# Patient Record
Sex: Female | Born: 1955 | Race: White | Hispanic: No | Marital: Married | Smoking: Never smoker
Health system: Southern US, Community
[De-identification: ages and names within clinical notes are randomized; demographics above are authoritative.]

## PROBLEM LIST (undated history)

## (undated) DIAGNOSIS — K219 Gastro-esophageal reflux disease without esophagitis: Secondary | ICD-10-CM

## (undated) DIAGNOSIS — G629 Polyneuropathy, unspecified: Secondary | ICD-10-CM

## (undated) DIAGNOSIS — R112 Nausea with vomiting, unspecified: Secondary | ICD-10-CM

## (undated) DIAGNOSIS — R234 Changes in skin texture: Secondary | ICD-10-CM

## (undated) DIAGNOSIS — I739 Peripheral vascular disease, unspecified: Secondary | ICD-10-CM

## (undated) DIAGNOSIS — Z9889 Other specified postprocedural states: Secondary | ICD-10-CM

## (undated) DIAGNOSIS — K146 Glossodynia: Secondary | ICD-10-CM

## (undated) DIAGNOSIS — M797 Fibromyalgia: Secondary | ICD-10-CM

## (undated) DIAGNOSIS — I872 Venous insufficiency (chronic) (peripheral): Secondary | ICD-10-CM

## (undated) DIAGNOSIS — M1990 Unspecified osteoarthritis, unspecified site: Secondary | ICD-10-CM

## (undated) DIAGNOSIS — F419 Anxiety disorder, unspecified: Secondary | ICD-10-CM

## (undated) DIAGNOSIS — D649 Anemia, unspecified: Secondary | ICD-10-CM

## (undated) DIAGNOSIS — J189 Pneumonia, unspecified organism: Secondary | ICD-10-CM

## (undated) HISTORY — PX: HAMMER TOE SURGERY: SHX385

## (undated) HISTORY — PX: NASAL SEPTUM SURGERY: SHX37

## (undated) HISTORY — PX: TONSILLECTOMY: SUR1361

## (undated) HISTORY — PX: VAGINAL HYSTERECTOMY: SUR661

## (undated) HISTORY — PX: CARPAL TUNNEL RELEASE: SHX101

## (undated) HISTORY — PX: COLONOSCOPY: SHX174

## (undated) HISTORY — PX: TUBAL LIGATION: SHX77

---

## 2011-01-10 HISTORY — PX: BACK SURGERY: SHX140

## 2014-02-16 ENCOUNTER — Other Ambulatory Visit: Payer: Self-pay | Admitting: Neurosurgery

## 2014-03-02 ENCOUNTER — Inpatient Hospital Stay (HOSPITAL_COMMUNITY)
Admission: RE | Admit: 2014-03-02 | Discharge: 2014-03-02 | Disposition: A | Discharge status: Home / Self Care | Payer: Self-pay | Source: Ambulatory Visit

## 2014-03-02 ENCOUNTER — Other Ambulatory Visit (HOSPITAL_COMMUNITY): Payer: Self-pay | Admitting: *Deleted

## 2014-03-02 NOTE — Pre-Procedure Instructions (Signed)
Robyn BryantLinda T. Robyn Smith  03/02/2014   Your procedure is scheduled on:  Tuesday, March 10, 2014 at 7:30 AM.   [facility] Entr[facility] "A" Admitting Office at 5:30 AM.   Call this number if you have problems the morning of surgery: [phone]               Any questions prior to day of surgery, please call [phone] between 8 & 4 PM.   Remember:   Do not eat food or drink liquids after midnight Monday, 03/09/14.   Take these medicines the morning of surgery with A SIP OF WATER: Alprazolam (Xanax) - if needed, Oxcycodone-Acetaminophen (Percocet) - if needed.  Stop Diclofenac (Voltaren) as of tomorrow (03/03/14).   Do not wear jewelry, make-up or nail polish.  Do not wear lotions, powders, or perfumes. You may wear deodorant.  Do not shave 48 hours prior to surgery.   Do not bring valuables to the hospital.  [facility] not responsible                  for any belongings or valuables.               Contacts, dentures or bridgework may not be worn into surgery.  Leave suitcase in the car. After surgery it may be brought to your room.  For patients admitted to the hospital, discharge time is determined by your                treatment team.             Special Instructions: [facility] - Preparing for Surgery  Before surgery, you can play an important role.  Because skin is not sterile, your skin needs to be as free of germs as possible.  You can reduce the number of germs on you skin by washing with CHG (chlorahexidine gluconate) soap before surgery.  CHG is an antiseptic cleaner which kills germs and bonds with the skin to continue killing germs even after washing.  Please DO NOT use if you have an allergy to CHG or antibacterial soaps.  If your skin becomes reddened/irritated stop using the CHG and inform your nurse when you arrive at Short Stay.  Do not shave (including legs and underarms) for at least 48 hours prior to the first CHG shower.  You may shave your  face.  Please follow these instructions carefully:   1.  Shower with CHG Soap the night before surgery and the                                morning of Surgery.  2.  If you choose to wash your hair, wash your hair first as usual with your       normal shampoo.  3.  After you shampoo, rinse your hair and body thoroughly to remove the                      Shampoo.  4.  Use CHG as you would any other liquid soap.  You can apply chg directly       to the skin and wash gently with scrungie or a clean washcloth.  5.  Apply the CHG Soap to your body ONLY FROM THE NECK DOWN.        Do not use on open wounds or open sores.  Avoid contact with your eyes,  ears, mouth and genitals (private parts).  Wash genitals (private parts) with your normal soap.  6.  Wash thoroughly, paying special attention to the area where your surgery        will be performed.  7.  Thoroughly rinse your body with warm water from the neck down.  8.  DO NOT shower/wash with your normal soap after using and rinsing off       the CHG Soap.  9.  Pat yourself dry with a clean towel.            10.  Wear clean pajamas.            11.  Place clean sheets on your bed the night of your first shower and do not        sleep with pets.  Day of Surgery  Do not apply any lotions the morning of surgery.  Please wear clean clothes to the hospital.     Please read over the following fact sheets that you were given: Pain Booklet, Coughing and Deep Breathing, Blood Transfusion Information, MRSA Information and Surgical Site Infection Prevention

## 2014-03-04 ENCOUNTER — Encounter (HOSPITAL_COMMUNITY)
Admission: RE | Admit: 2014-03-04 | Discharge: 2014-03-04 | Disposition: A | Discharge status: Home / Self Care | Payer: BLUE CROSS/BLUE SHIELD | Source: Ambulatory Visit | Attending: Neurosurgery | Admitting: Neurosurgery

## 2014-03-04 ENCOUNTER — Encounter (HOSPITAL_COMMUNITY): Payer: Self-pay

## 2014-03-04 DIAGNOSIS — Z01812 Encounter for preprocedural laboratory examination: Secondary | ICD-10-CM | POA: Insufficient documentation

## 2014-03-04 DIAGNOSIS — M4806 Spinal stenosis, lumbar region: Secondary | ICD-10-CM | POA: Insufficient documentation

## 2014-03-04 DIAGNOSIS — Z0183 Encounter for blood typing: Secondary | ICD-10-CM | POA: Insufficient documentation

## 2014-03-04 DIAGNOSIS — M4316 Spondylolisthesis, lumbar region: Secondary | ICD-10-CM | POA: Diagnosis not present

## 2014-03-04 DIAGNOSIS — Z01818 Encounter for other preprocedural examination: Secondary | ICD-10-CM | POA: Diagnosis present

## 2014-03-04 DIAGNOSIS — M4120 Other idiopathic scoliosis, site unspecified: Secondary | ICD-10-CM | POA: Insufficient documentation

## 2014-03-04 DIAGNOSIS — Z880 Allergy status to penicillin: Secondary | ICD-10-CM | POA: Insufficient documentation

## 2014-03-04 HISTORY — DX: Fibromyalgia: M79.7

## 2014-03-04 HISTORY — DX: Anxiety disorder, unspecified: F41.9

## 2014-03-04 HISTORY — DX: Gastro-esophageal reflux disease without esophagitis: K21.9

## 2014-03-04 HISTORY — DX: Peripheral vascular disease, unspecified: I73.9

## 2014-03-04 HISTORY — DX: Pneumonia, unspecified organism: J18.9

## 2014-03-04 HISTORY — DX: Other specified postprocedural states: Z98.890

## 2014-03-04 HISTORY — DX: Anemia, unspecified: D64.9

## 2014-03-04 HISTORY — DX: Unspecified osteoarthritis, unspecified site: M19.90

## 2014-03-04 HISTORY — DX: Nausea with vomiting, unspecified: R11.2

## 2014-03-04 LAB — CBC
HCT: 38.7 % (ref 36.0–46.0)
Hemoglobin: 12.9 g/dL (ref 12.0–15.0)
MCH: 31 pg (ref 26.0–34.0)
MCHC: 33.3 g/dL (ref 30.0–36.0)
MCV: 93 fL (ref 78.0–100.0)
PLATELETS: 294 10*3/uL (ref 150–400)
RBC: 4.16 MIL/uL (ref 3.87–5.11)
RDW: 12.8 % (ref 11.5–15.5)
WBC: 6.7 10*3/uL (ref 4.0–10.5)

## 2014-03-04 LAB — BASIC METABOLIC PANEL
ANION GAP: 11 (ref 5–15)
BUN: 14 mg/dL (ref 6–23)
CALCIUM: 9.2 mg/dL (ref 8.4–10.5)
CO2: 25 mmol/L (ref 19–32)
CREATININE: 0.74 mg/dL (ref 0.50–1.10)
Chloride: 104 mmol/L (ref 96–112)
GFR calc non Af Amer: >90 mL/min (ref >90)
Glucose, Bld: 97 mg/dL (ref 70–99)
Potassium: 4.1 mmol/L (ref 3.5–5.1)
Sodium: 140 mmol/L (ref 135–145)

## 2014-03-04 LAB — TYPE AND SCREEN
ABO/RH(D): O POS
Antibody Screen: NEGATIVE

## 2014-03-04 LAB — ABO/RH: ABO/RH(D): O POS

## 2014-03-04 LAB — SURGICAL PCR SCREEN
MRSA, PCR: NEGATIVE
Staphylococcus aureus: POSITIVE — AB

## 2014-03-04 NOTE — Progress Notes (Signed)
Pt states she had chest pain a "couple of years ago" and was told it was GERD. States she just continues to see the cardiologist once a year. States she's had an Echo and Stress test within the last 2 years and possibly an EKG this past year. Requested Echo, Stress and EKG from Dr. Oneta RackKotlaba's [place] in [place], [place].

## 2014-03-07 NOTE — H&P (Signed)
Patient ID:   [id number] Patient: Robyn Smith  Date of Birth: 1955/06/19 Visit Type: Patient Communication   Date: 03/02/2014 11:19 AM Provider: Danae Orleans. Venetia Maxon MD   This 59 year old female presents for Spinal stenosis.  History of Present Illness: 1.  Spinal stenosis  My recommendation for surgery to this patient is based on severe foraminal and extraforaminal nerve root compression due to spinal stenosis which is secondary to lumbar scoliosis.  I do not believe there is a nonsurgical option for correcting this problem and the patient is already undergone extensive pain management and injection therapy as well as physical therapy.  The insurance reviewer's interpretation that she does not have instability is not accurate based on my review of her radiographs.  This is outlined below.        MEDICATIONS(added, continued or stopped this visit): Started Medication Directions Instruction Stopped   arnica flower (bulk) tincture     02/11/2014 diclofenac sodium 75 mg tablet,delayed release take 1 tablet by oral route 2 times every day    02/17/2014 Percocet 10 mg-325 mg tablet take 1 tablet by oral route  every 6-8 hours as needed, 30 day supply, DNF 02/17/14    01/18/2014 Percocet 10 mg-325 mg tablet take 1 tablet by oral route  every 6-8 hours as needed, 30 day supply, DNF 01/18/14       ALLERGIES: Ingredient Reaction Medication Name Comment  PENICILLINS          DIAGNOSTIC RESULTS Insurance reviewer has apparently denied Mrs. Santo's request for surgery.  They state the grounds that there is no evidence of instability of her lumbar spine.  This is absolutely not the case.  The patient has coronal spondylolisthesis and malalignment at L3 L4 of 12 mm appreciated on AP film and this is contributing to significant foraminal and extraforaminal L3 nerve root compression on the left.  Additionally there is grade 1 spondylolisthesis of L4 and L5 of 6.5 mm on flexion which reduces to 0  mm on extension and the Cobb angle of her scoliosis is 20.    IMPRESSION The patient demonstrates spinal instability and the reason for proceeding with scoliosis correction is because of nerve compression rather than prophylactically for back pain.  I have requested an expedited.  Review of her situation as I think that these facts reflected in my review of her radiographs support my recommendation for surgery.             Provider:  Danae Orleans. Venetia Maxon MD  03/04/2014 05:12 PM Dictation edited by: Danae Orleans. Venetia Maxon    CC Providers: Carlota Raspberry [address] [place],  [place]  [address]-   Carlota Raspberry [address] [place], [place] [address]-              Electronically signed by Danae Orleans. Venetia Maxon MD on 03/04/2014 05:12 PM  Patient ID:   [id number] Patient: Robyn Smith  Date of Birth: 1955-02-24 Visit Type: Office Visit   Date: 02/11/2014 02:15 PM Provider: Danae Orleans. Venetia Maxon MD   This 59 year old female presents for back pain.  History of Present Illness: 1.  back pain  Last seen by Dr. Venetia Maxon with ACDF C5-C6 12/29/10, Robyn Smith returns reporting lumbar pain that has been managed by Dr. Ollen Bowl and Celedonio Miyamoto PA-C since 2013.  Her primary care physician in [place], Dr. Harland Dingwall, ordered an MRI in December and encouraged her to come out of work.   She reports no problems with her neck or  upper extremities since her surgery in 2012.  Lumbar MRI on [place]  Patient has bilateral sciatic notch discomfort and bilateral lower extremity pain, right greater than left with pain radiating into both of her ankles.  Lumbar radiographs demonstrate dextral convex scoliosis and her MRI demonstrates significant stenosis with severe foraminal stenosis throughout her lumbar spine.  Patient has severe pain and is limited in her ability to function and wishes to proceed with surgery.  She says that injections are no longer giving her relief.  She is concerned about her ability to return  to work as she says she has to lift greater than 80 pounds at [employer].      Medical/Surgical/Interim History Reviewed, no change.  Last detailed document date:08/19/2013.   PAST MEDICAL HISTORY, SURGICAL HISTORY, FAMILY HISTORY, SOCIAL HISTORY AND REVIEW OF SYSTEMS I have reviewed the patient's past medical, surgical, family and social history as well as the comprehensive review of systems as included on the [facility] history form dated 08/19/2013, which I have signed.  Family History: Reviewed, no changes.    Social History: Tobacco use reviewed. Reviewed, no changes.     MEDICATIONS(added, continued or stopped this visit): Started Medication Directions Instruction Stopped   arnica flower (bulk) tincture     02/11/2014 diclofenac sodium 75 mg tablet,delayed release take 1 tablet by oral route 2 times every day     meloxicam 7.5 mg tablet take 1 tablet by oral route 2 times every day  02/11/2014  11/20/2013 nortriptyline 75 mg capsule take 1 capsule by oral route  every 2 days  02/11/2014  11/20/2013 Percocet 10 mg-325 mg tablet take 1 tablet by oral route  every 6-8 hours as needed, 30 day supply  02/11/2014  12/19/2013 Percocet 10 mg-325 mg tablet take 1 tablet by oral route  every 6-8 hours as needed, 30 day supply, DNF 12/19/13  02/11/2014  01/18/2014 Percocet 10 mg-325 mg tablet take 1 tablet by oral route  every 6-8 hours as needed, 30 day supply, DNF 01/18/14       ALLERGIES: Ingredient Reaction Medication Name Comment  PENICILLINS      Reviewed, no changes.    Vitals Date Temp F BP Pulse Ht In Wt Lb BMI BSA Pain Score  02/11/2014  115/73 82 69 192 28.35  7/10      IMPRESSION Severe scoliosis, spinal stenosis, spondylolisthesis, radiculopathy L1-2, L2-3, L3 L4, L4-L5 levels  Completed Orders (this encounter) Order Details Reason Side Interpretation Result Initial Treatment Date Region  Lifestyle education regarding diet  Encouraged to eat a well balanced diet and follow up with primary care physician.        Lumbar Spine- AP/Lat/Flex/Ex 1 of 3     02/11/2014 All Levels to All Levels  Hip - Right Lateral W/AP Pelvis 2 of 3     02/11/2014 All Levels to All Levels  Scoliosis- AP/Lat 3 of 3 and RT HIP SERIES and XR 4V L/S     02/11/2014 All Levels to All Levels   Assessment/Plan # Detail Type Description   1. Assessment Spinal stenosis of lumbar region (M48.06).       2. Assessment Spondylolisthesis, lumbar region (M43.16).       3. Assessment Body mass index (BMI) 28.0-28.9, adult (Z61.09).   Plan Orders Today's instructions / counseling include(s) Lifestyle education regarding diet.       4. Assessment Trochanteric bursitis of right hip (M70.61).       5. Assessment Radiculopathy, lumbar  region (M54.16).       6. Assessment Scoliosis (and kyphoscoliosis), idiopathic (M41.20).         Pain Assessment/Treatment Pain Scale: 7/10. Method: Numeric Pain Intensity Scale. Location: back. Onset: 08/19/2013. Duration: varies. Quality: discomforting. Pain Assessment/Treatment follow-up plan of care: Patient is taking medications as prescribed..  Anterolateral decompression and fusion with left-sided approach L1-2, L2-3, L3 L4, L4 L5 levels with percutaneous pedicle screw fixation.  LSO brace is fitted today.  Risks and benefits were discussed in detail with regard to surgery as well as alternatives to surgery.the patient wishes to proceed with surgery.  Detailed models and explanations were provided as well as patient teaching.  Orders: Diagnostic Procedures: Assessment Procedure  M54.16 Lumbar Spine- AP/Lat/Flex/Ex  M54.16 Scoliosis- AP/Lat  M54.16 XLIF (left)  - L1-L2 - L2-L3 - L3-L4 - L4-L5 ; posterior percutaneous pedicle screws and rods  M70.61 Hip - Right Lateral W/AP Pelvis  Instruction(s)/Education: Assessment Instruction  Z68.28 Lifestyle education regarding diet             Provider:   Danae OrleansJoseph D. Venetia MaxonStern MD  02/22/2014 03:08 PM Dictation edited by: Danae OrleansJoseph D. Venetia MaxonStern    CC Providers: Carlota RaspberryEric  Davidson [address] [place],  [place]  [phone]-   Carlota RaspberryEric Davidson [address] [place], [place] [phone]-              Electronically signed by Danae OrleansJoseph D. Venetia MaxonStern MD on 02/22/2014 03:08 PM

## 2014-03-09 MED ORDER — VANCOMYCIN HCL IN DEXTROSE 1-5 GM/200ML-% IV SOLN
1000.0000 mg | INTRAVENOUS | Status: DC
  Filled 2014-03-09: qty 200

## 2014-03-10 ENCOUNTER — Encounter (HOSPITAL_COMMUNITY): Payer: Self-pay | Admitting: *Deleted

## 2014-03-10 ENCOUNTER — Inpatient Hospital Stay (HOSPITAL_COMMUNITY)
Admission: RE | Admit: 2014-03-10 | Discharge: 2014-03-13 | DRG: 458 | Disposition: A | Discharge status: Home Health | Payer: BLUE CROSS/BLUE SHIELD | Source: Ambulatory Visit | Attending: Neurosurgery | Admitting: Neurosurgery

## 2014-03-10 ENCOUNTER — Inpatient Hospital Stay (HOSPITAL_COMMUNITY): Payer: BLUE CROSS/BLUE SHIELD

## 2014-03-10 ENCOUNTER — Inpatient Hospital Stay (HOSPITAL_COMMUNITY): Payer: BLUE CROSS/BLUE SHIELD | Admitting: Certified Registered Nurse Anesthetist

## 2014-03-10 ENCOUNTER — Encounter (HOSPITAL_COMMUNITY)
Admission: RE | Disposition: A | Discharge status: Home Health | Payer: BLUE CROSS/BLUE SHIELD | Source: Ambulatory Visit | Attending: Neurosurgery

## 2014-03-10 DIAGNOSIS — M5416 Radiculopathy, lumbar region: Secondary | ICD-10-CM | POA: Diagnosis present

## 2014-03-10 DIAGNOSIS — M4310 Spondylolisthesis, site unspecified: Secondary | ICD-10-CM | POA: Diagnosis present

## 2014-03-10 DIAGNOSIS — M419 Scoliosis, unspecified: Secondary | ICD-10-CM | POA: Diagnosis present

## 2014-03-10 DIAGNOSIS — M4806 Spinal stenosis, lumbar region: Secondary | ICD-10-CM | POA: Diagnosis present

## 2014-03-10 DIAGNOSIS — Z419 Encounter for procedure for purposes other than remedying health state, unspecified: Secondary | ICD-10-CM

## 2014-03-10 DIAGNOSIS — M4126 Other idiopathic scoliosis, lumbar region: Secondary | ICD-10-CM | POA: Diagnosis present

## 2014-03-10 HISTORY — PX: ANTERIOR LATERAL LUMBAR FUSION 4 LEVELS: SHX5552

## 2014-03-10 HISTORY — PX: LUMBAR PERCUTANEOUS PEDICLE SCREW 4 LEVEL: SHX6318

## 2014-03-10 SURGERY — ANTERIOR LATERAL LUMBAR FUSION 4 LEVELS
Anesthesia: General | Site: Spine Lumbar | Laterality: Left

## 2014-03-10 MED ORDER — ONDANSETRON HCL 4 MG/2ML IJ SOLN
INTRAMUSCULAR | Status: DC | PRN
  Administered 2014-03-10: 4 mg via INTRAVENOUS

## 2014-03-10 MED ORDER — 0.9 % SODIUM CHLORIDE (POUR BTL) OPTIME
TOPICAL | Status: DC | PRN
  Administered 2014-03-10: 1000 mL

## 2014-03-10 MED ORDER — FENTANYL CITRATE 0.05 MG/ML IJ SOLN
INTRAMUSCULAR | Status: AC
  Filled 2014-03-10: qty 5

## 2014-03-10 MED ORDER — HYDROMORPHONE HCL 1 MG/ML IJ SOLN
0.5000 mg | INTRAMUSCULAR | Status: DC | PRN
  Administered 2014-03-10 – 2014-03-12 (×12): 1 mg via INTRAVENOUS
  Filled 2014-03-10 (×13): qty 1

## 2014-03-10 MED ORDER — ALPRAZOLAM 0.25 MG PO TABS
0.2500 mg | ORAL_TABLET | Freq: Two times a day (BID) | ORAL | Status: DC | PRN
  Administered 2014-03-12 (×3): 0.25 mg via ORAL
  Filled 2014-03-10 (×3): qty 1

## 2014-03-10 MED ORDER — METHOCARBAMOL 1000 MG/10ML IJ SOLN
500.0000 mg | Freq: Four times a day (QID) | INTRAVENOUS | Status: DC | PRN
  Filled 2014-03-10: qty 5

## 2014-03-10 MED ORDER — PROMETHAZINE HCL 25 MG/ML IJ SOLN: 6.2500 mg | INTRAMUSCULAR | Status: DC | PRN

## 2014-03-10 MED ORDER — PANTOPRAZOLE SODIUM 40 MG IV SOLR
40.0000 mg | Freq: Every day | INTRAVENOUS | Status: DC
  Administered 2014-03-10 – 2014-03-11 (×2): 40 mg via INTRAVENOUS
  Filled 2014-03-10 (×2): qty 40

## 2014-03-10 MED ORDER — KCL IN DEXTROSE-NACL 20-5-0.45 MEQ/L-%-% IV SOLN
INTRAVENOUS | Status: AC
  Filled 2014-03-10: qty 1000

## 2014-03-10 MED ORDER — DOCUSATE SODIUM 100 MG PO CAPS
100.0000 mg | ORAL_CAPSULE | Freq: Two times a day (BID) | ORAL | Status: DC
  Administered 2014-03-10 – 2014-03-13 (×6): 100 mg via ORAL
  Filled 2014-03-10 (×6): qty 1

## 2014-03-10 MED ORDER — BUPIVACAINE HCL (PF) 0.5 % IJ SOLN
INTRAMUSCULAR | Status: DC | PRN
  Administered 2014-03-10: 17.5 mL
  Administered 2014-03-10: 7.5 mL

## 2014-03-10 MED ORDER — ONDANSETRON HCL 4 MG/2ML IJ SOLN
4.0000 mg | INTRAMUSCULAR | Status: DC | PRN
  Administered 2014-03-10 – 2014-03-12 (×3): 4 mg via INTRAVENOUS
  Filled 2014-03-10 (×3): qty 2

## 2014-03-10 MED ORDER — ARTIFICIAL TEARS OP OINT
TOPICAL_OINTMENT | OPHTHALMIC | Status: DC | PRN
  Administered 2014-03-10: 1 via OPHTHALMIC

## 2014-03-10 MED ORDER — KCL IN DEXTROSE-NACL 20-5-0.45 MEQ/L-%-% IV SOLN
INTRAVENOUS | Status: DC
  Administered 2014-03-10: 15:00:00 via INTRAVENOUS

## 2014-03-10 MED ORDER — SODIUM CHLORIDE 0.9 % IJ SOLN
3.0000 mL | Freq: Two times a day (BID) | INTRAMUSCULAR | Status: DC
  Administered 2014-03-11 – 2014-03-12 (×3): 3 mL via INTRAVENOUS

## 2014-03-10 MED ORDER — ALUM & MAG HYDROXIDE-SIMETH 200-200-20 MG/5ML PO SUSP: 30.0000 mL | Freq: Four times a day (QID) | ORAL | Status: DC | PRN

## 2014-03-10 MED ORDER — MENTHOL 3 MG MT LOZG: 1.0000 | LOZENGE | OROMUCOSAL | Status: DC | PRN

## 2014-03-10 MED ORDER — HYDROMORPHONE HCL 1 MG/ML IJ SOLN
0.2500 mg | INTRAMUSCULAR | Status: DC | PRN
  Administered 2014-03-10 (×3): 0.5 mg via INTRAVENOUS

## 2014-03-10 MED ORDER — EPHEDRINE SULFATE 50 MG/ML IJ SOLN
INTRAMUSCULAR | Status: DC | PRN
  Administered 2014-03-10 (×3): 10 mg via INTRAVENOUS

## 2014-03-10 MED ORDER — SODIUM CHLORIDE 0.9 % IJ SOLN: 3.0000 mL | INTRAMUSCULAR | Status: DC | PRN

## 2014-03-10 MED ORDER — EPHEDRINE SULFATE 50 MG/ML IJ SOLN
INTRAMUSCULAR | Status: AC
  Filled 2014-03-10: qty 1

## 2014-03-10 MED ORDER — PROPOFOL 10 MG/ML IV EMUL
INTRAVENOUS | Status: AC
  Administered 2014-03-10 (×2): 50 ug/kg/min via INTRAVENOUS
  Filled 2014-03-10: qty 200

## 2014-03-10 MED ORDER — SUCCINYLCHOLINE CHLORIDE 20 MG/ML IJ SOLN
INTRAMUSCULAR | Status: AC
  Filled 2014-03-10: qty 2

## 2014-03-10 MED ORDER — LIDOCAINE HCL (CARDIAC) 20 MG/ML IV SOLN
INTRAVENOUS | Status: AC
  Filled 2014-03-10: qty 5

## 2014-03-10 MED ORDER — MIDAZOLAM HCL 2 MG/2ML IJ SOLN
INTRAMUSCULAR | Status: AC
  Filled 2014-03-10: qty 2

## 2014-03-10 MED ORDER — DEXAMETHASONE SODIUM PHOSPHATE 4 MG/ML IJ SOLN
INTRAMUSCULAR | Status: DC | PRN
  Administered 2014-03-10: 8 mg via INTRAVENOUS

## 2014-03-10 MED ORDER — ACETAMINOPHEN 325 MG PO TABS: 650.0000 mg | ORAL_TABLET | ORAL | Status: DC | PRN

## 2014-03-10 MED ORDER — HYDROCODONE-ACETAMINOPHEN 5-325 MG PO TABS: 1.0000 | ORAL_TABLET | ORAL | Status: DC | PRN

## 2014-03-10 MED ORDER — LIDOCAINE-EPINEPHRINE 1 %-1:100000 IJ SOLN
INTRAMUSCULAR | Status: DC | PRN
  Administered 2014-03-10: 7.5 mL
  Administered 2014-03-10: 17.5 mL

## 2014-03-10 MED ORDER — STERILE WATER FOR INJECTION IJ SOLN
INTRAMUSCULAR | Status: AC
  Filled 2014-03-10: qty 10

## 2014-03-10 MED ORDER — SENNA 8.6 MG PO TABS
1.0000 | ORAL_TABLET | Freq: Two times a day (BID) | ORAL | Status: DC
  Administered 2014-03-10 – 2014-03-13 (×6): 8.6 mg via ORAL
  Filled 2014-03-10 (×6): qty 1

## 2014-03-10 MED ORDER — VANCOMYCIN HCL IN DEXTROSE 1-5 GM/200ML-% IV SOLN
INTRAVENOUS | Status: AC
  Administered 2014-03-10: 1000 mg via INTRAVENOUS
  Filled 2014-03-10: qty 200

## 2014-03-10 MED ORDER — SODIUM CHLORIDE 0.9 % IV SOLN: 250.0000 mL | INTRAVENOUS | Status: DC

## 2014-03-10 MED ORDER — PROMETHAZINE HCL 25 MG/ML IJ SOLN
12.5000 mg | Freq: Four times a day (QID) | INTRAMUSCULAR | Status: DC | PRN
  Administered 2014-03-10 – 2014-03-11 (×3): 12.5 mg via INTRAVENOUS
  Filled 2014-03-10 (×3): qty 1

## 2014-03-10 MED ORDER — FENTANYL CITRATE 0.05 MG/ML IJ SOLN
INTRAMUSCULAR | Status: DC | PRN
  Administered 2014-03-10: 100 ug via INTRAVENOUS
  Administered 2014-03-10 (×2): 50 ug via INTRAVENOUS
  Administered 2014-03-10: 100 ug via INTRAVENOUS
  Administered 2014-03-10 (×3): 50 ug via INTRAVENOUS
  Administered 2014-03-10: 100 ug via INTRAVENOUS
  Administered 2014-03-10: 50 ug via INTRAVENOUS

## 2014-03-10 MED ORDER — MIDAZOLAM HCL 5 MG/5ML IJ SOLN
INTRAMUSCULAR | Status: DC | PRN
  Administered 2014-03-10: 2 mg via INTRAVENOUS

## 2014-03-10 MED ORDER — METHOCARBAMOL 500 MG PO TABS
500.0000 mg | ORAL_TABLET | Freq: Four times a day (QID) | ORAL | Status: DC | PRN
  Administered 2014-03-10 – 2014-03-13 (×10): 500 mg via ORAL
  Filled 2014-03-10 (×11): qty 1

## 2014-03-10 MED ORDER — SUCCINYLCHOLINE CHLORIDE 20 MG/ML IJ SOLN
INTRAMUSCULAR | Status: DC | PRN
  Administered 2014-03-10: 120 mg via INTRAVENOUS

## 2014-03-10 MED ORDER — PHENYLEPHRINE HCL 10 MG/ML IJ SOLN
INTRAMUSCULAR | Status: DC | PRN
  Administered 2014-03-10 (×5): 80 ug via INTRAVENOUS

## 2014-03-10 MED ORDER — OXYCODONE-ACETAMINOPHEN 10-325 MG PO TABS: 1.0000 | ORAL_TABLET | ORAL | Status: DC | PRN

## 2014-03-10 MED ORDER — LIDOCAINE HCL (CARDIAC) 20 MG/ML IV SOLN
INTRAVENOUS | Status: DC | PRN
  Administered 2014-03-10: 80 mg via INTRAVENOUS

## 2014-03-10 MED ORDER — PROPOFOL 10 MG/ML IV BOLUS
INTRAVENOUS | Status: AC
  Filled 2014-03-10: qty 20

## 2014-03-10 MED ORDER — HEMOSTATIC AGENTS (NO CHARGE) OPTIME
TOPICAL | Status: DC | PRN
  Administered 2014-03-10: 1 via TOPICAL

## 2014-03-10 MED ORDER — SODIUM CHLORIDE 0.9 % IV SOLN
1250.0000 mg | Freq: Once | INTRAVENOUS | Status: AC
  Administered 2014-03-10: 1250 mg via INTRAVENOUS
  Filled 2014-03-10: qty 1250

## 2014-03-10 MED ORDER — PROPOFOL 10 MG/ML IV BOLUS
INTRAVENOUS | Status: DC | PRN
  Administered 2014-03-10: 180 mg via INTRAVENOUS

## 2014-03-10 MED ORDER — HYDROMORPHONE HCL 1 MG/ML IJ SOLN
INTRAMUSCULAR | Status: AC
  Administered 2014-03-10: 17:00:00
  Filled 2014-03-10: qty 1

## 2014-03-10 MED ORDER — OXYCODONE-ACETAMINOPHEN 5-325 MG PO TABS
1.0000 | ORAL_TABLET | ORAL | Status: DC | PRN
  Administered 2014-03-10 – 2014-03-13 (×11): 2 via ORAL
  Filled 2014-03-10 (×11): qty 2

## 2014-03-10 MED ORDER — PHENOL 1.4 % MT LIQD: 1.0000 | OROMUCOSAL | Status: DC | PRN

## 2014-03-10 MED ORDER — THROMBIN 5000 UNITS EX SOLR
CUTANEOUS | Status: DC | PRN
  Administered 2014-03-10 (×2): 5000 [IU] via TOPICAL

## 2014-03-10 MED ORDER — LACTATED RINGERS IV SOLN
INTRAVENOUS | Status: DC | PRN
  Administered 2014-03-10 (×3): via INTRAVENOUS

## 2014-03-10 MED ORDER — ACETAMINOPHEN 650 MG RE SUPP: 650.0000 mg | RECTAL | Status: DC | PRN

## 2014-03-10 MED ORDER — ONDANSETRON HCL 4 MG/2ML IJ SOLN
INTRAMUSCULAR | Status: AC
  Filled 2014-03-10: qty 2

## 2014-03-10 MED ORDER — HYDROMORPHONE HCL 1 MG/ML IJ SOLN
INTRAMUSCULAR | Status: AC
  Administered 2014-03-10: 0.5 mg
  Filled 2014-03-10: qty 1

## 2014-03-10 MED ORDER — ROCURONIUM BROMIDE 50 MG/5ML IV SOLN
INTRAVENOUS | Status: AC
  Filled 2014-03-10: qty 1

## 2014-03-10 SURGICAL SUPPLY — 84 items
BENZOIN TINCTURE PRP APPL 2/3 (GAUZE/BANDAGES/DRESSINGS) IMPLANT
BLADE CLIPPER SURG (BLADE) IMPLANT
BONE MATRIX OSTEOCEL PRO MED (Bone Implant) ×12 IMPLANT
CLIP NEUROVISION LG (CLIP) ×3 IMPLANT
CLOSURE WOUND 1/2 X4 (GAUZE/BANDAGES/DRESSINGS)
CONT SPEC 4OZ CLIKSEAL STRL BL (MISCELLANEOUS) ×3 IMPLANT
CORENT WIDE 10X22X55 (Orthopedic Implant) ×3 IMPLANT
COROENT WIDE 10X22X55 (Orthopedic Implant) ×1 IMPLANT
COROENT XL 12X22X45 (Intraocular Lens) ×3 IMPLANT
COROENT XL-W 10X22X50 (Orthopedic Implant) ×3 IMPLANT
COVER BACK TABLE 24X17X13 BIG (DRAPES) IMPLANT
COVER BACK TABLE 60X90IN (DRAPES) ×3 IMPLANT
DECANTER SPIKE VIAL GLASS SM (MISCELLANEOUS) ×9 IMPLANT
DRAPE C-ARM 42X72 X-RAY (DRAPES) ×6 IMPLANT
DRAPE C-ARMOR (DRAPES) ×9 IMPLANT
DRAPE LAPAROTOMY 100X72X124 (DRAPES) ×6 IMPLANT
DRAPE POUCH INSTRU U-SHP 10X18 (DRAPES) ×6 IMPLANT
DRAPE SURG 17X23 STRL (DRAPES) ×6 IMPLANT
DRSG OPSITE POSTOP 3X4 (GAUZE/BANDAGES/DRESSINGS) ×15 IMPLANT
DRSG OPSITE POSTOP 4X8 (GAUZE/BANDAGES/DRESSINGS) ×6 IMPLANT
DRSG TELFA 3X8 NADH (GAUZE/BANDAGES/DRESSINGS) IMPLANT
DURAPREP 26ML APPLICATOR (WOUND CARE) ×6 IMPLANT
ELECT REM PT RETURN 9FT ADLT (ELECTROSURGICAL) ×6
ELECTRODE REM PT RTRN 9FT ADLT (ELECTROSURGICAL) ×2 IMPLANT
GAUZE SPONGE 4X4 12PLY STRL (GAUZE/BANDAGES/DRESSINGS) IMPLANT
GAUZE SPONGE 4X4 16PLY XRAY LF (GAUZE/BANDAGES/DRESSINGS) ×6 IMPLANT
GLOVE BIO SURGEON STRL SZ8 (GLOVE) ×6 IMPLANT
GLOVE BIOGEL PI IND STRL 7.5 (GLOVE) ×3 IMPLANT
GLOVE BIOGEL PI IND STRL 8 (GLOVE) ×2 IMPLANT
GLOVE BIOGEL PI IND STRL 8.5 (GLOVE) ×2 IMPLANT
GLOVE BIOGEL PI INDICATOR 7.5 (GLOVE) ×6
GLOVE BIOGEL PI INDICATOR 8 (GLOVE) ×4
GLOVE BIOGEL PI INDICATOR 8.5 (GLOVE) ×4
GLOVE ECLIPSE 8.0 STRL XLNG CF (GLOVE) ×9 IMPLANT
GLOVE EXAM NITRILE LRG STRL (GLOVE) IMPLANT
GLOVE EXAM NITRILE MD LF STRL (GLOVE) IMPLANT
GLOVE EXAM NITRILE XL STR (GLOVE) IMPLANT
GLOVE EXAM NITRILE XS STR PU (GLOVE) IMPLANT
GLOVE SURG SS PI 7.0 STRL IVOR (GLOVE) ×12 IMPLANT
GOWN STRL REUS W/ TWL LRG LVL3 (GOWN DISPOSABLE) ×1 IMPLANT
GOWN STRL REUS W/ TWL XL LVL3 (GOWN DISPOSABLE) ×5 IMPLANT
GOWN STRL REUS W/TWL 2XL LVL3 (GOWN DISPOSABLE) ×6 IMPLANT
GOWN STRL REUS W/TWL LRG LVL3 (GOWN DISPOSABLE) ×2
GOWN STRL REUS W/TWL XL LVL3 (GOWN DISPOSABLE) ×10
GUIDEWIRE NITINOL BEVEL TIP (WIRE) ×30 IMPLANT
IMPLANT COROENT XL 10X18X45 (Intraocular Lens) ×3 IMPLANT
KIT BASIN OR (CUSTOM PROCEDURE TRAY) ×3 IMPLANT
KIT DILATOR XLIF 5 (KITS) ×1 IMPLANT
KIT NEEDLE NVM5 EMG ELECT (KITS) ×2 IMPLANT
KIT NEEDLE NVM5 EMG ELECTRODE (KITS) ×4
KIT POSITION SURG JACKSON T1 (MISCELLANEOUS) IMPLANT
KIT ROOM TURNOVER OR (KITS) ×3 IMPLANT
KIT SURGICAL ACCESS MAXCESS 4 (KITS) ×6 IMPLANT
KIT XLIF (KITS) ×2
LIQUID BAND (GAUZE/BANDAGES/DRESSINGS) ×12 IMPLANT
MARKER SKIN DUAL TIP RULER LAB (MISCELLANEOUS) ×3 IMPLANT
NEEDLE HYPO 25X1 1.5 SAFETY (NEEDLE) ×6 IMPLANT
NEEDLE I PASS (NEEDLE) ×3 IMPLANT
NS IRRIG 1000ML POUR BTL (IV SOLUTION) ×3 IMPLANT
PACK LAMINECTOMY NEURO (CUSTOM PROCEDURE TRAY) ×6 IMPLANT
PAD ARMBOARD 7.5X6 YLW CONV (MISCELLANEOUS) ×30 IMPLANT
PATTIES SURGICAL .5 X.5 (GAUZE/BANDAGES/DRESSINGS) IMPLANT
PATTIES SURGICAL .5 X1 (DISPOSABLE) IMPLANT
PATTIES SURGICAL 1X1 (DISPOSABLE) IMPLANT
ROD RELINE LORDOTIC 5.5X140 (Rod) ×6 IMPLANT
SCREW LOCK RELINE 5.5 TULIP (Screw) ×30 IMPLANT
SCREW MAS RELINE 6.5X45 POLY (Screw) ×9 IMPLANT
SCREW MAS RELINE 6.5X50 POLY (Screw) ×9 IMPLANT
SCREW RELINE MAS POLY 5.5X45MM (Screw) ×12 IMPLANT
SPONGE LAP 4X18 X RAY DECT (DISPOSABLE) IMPLANT
SPONGE SURGIFOAM ABS GEL SZ50 (HEMOSTASIS) ×3 IMPLANT
STAPLER SKIN PROX WIDE 3.9 (STAPLE) ×3 IMPLANT
STRIP CLOSURE SKIN 1/2X4 (GAUZE/BANDAGES/DRESSINGS) IMPLANT
SUT VIC AB 1 CT1 18XBRD ANBCTR (SUTURE) ×2 IMPLANT
SUT VIC AB 1 CT1 8-18 (SUTURE) ×4
SUT VIC AB 2-0 CT1 18 (SUTURE) ×9 IMPLANT
SUT VIC AB 3-0 SH 8-18 (SUTURE) ×24 IMPLANT
SYR 20ML ECCENTRIC (SYRINGE) ×3 IMPLANT
SYR INSULIN 1ML 31GX6 SAFETY (SYRINGE) IMPLANT
TAPE CLOTH 3X10 TAN LF (GAUZE/BANDAGES/DRESSINGS) ×9 IMPLANT
TOWEL OR 17X24 6PK STRL BLUE (TOWEL DISPOSABLE) ×9 IMPLANT
TOWEL OR 17X26 10 PK STRL BLUE (TOWEL DISPOSABLE) ×6 IMPLANT
TRAY FOLEY CATH 14FRSI W/METER (CATHETERS) ×3 IMPLANT
WATER STERILE IRR 1000ML POUR (IV SOLUTION) ×3 IMPLANT

## 2014-03-10 NOTE — Brief Op Note (Signed)
03/10/2014  1:42 PM  PATIENT:  Robyn Smith  59 y.o. 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  POST-OPERATIVE DIAGNOSIS:  Radiculopathy,Lumbar region;Spinal stenosis,Lumbar region;Spondylolisthesis,lumbar region;Scoliosis and Kyphoscoliosis,Idiopathic L 12, L 23, L 34, L 45 levels  PROCEDURE:  Procedure(s) with comments: Left Lumbar One-Two,Lumbar Two-three,Lumbar Three-four,Lumbar four-Five Anterior lateral lumbar fusion with PEEK cages, allograft, percutaneous pedicle screws and rods  LUMBAR PERCUTANEOUS PEDICLE SCREW LUMBAR ONE-TWO,LUMBAR TWO-THREE,LUMBARTHREE-FOUR,LUMBAR FOUR-FIVE   SURGEON:  Surgeon(s) and Role:    * Maeola HarmanJoseph Telesia Ates, MD - Primary    * Reinaldo Meekerandy O Kritzer, MD - Assisting  PHYSICIAN ASSISTANT:   ASSISTANTS: [place], RN   ANESTHESIA:   general  EBL:  Total I/O In: 3600 [I.V.:3600] Out: 1250 [Urine:1000; Blood:250]  BLOOD ADMINISTERED:none  DRAINS: none   LOCAL MEDICATIONS USED:  LIDOCAINE   SPECIMEN:  No Specimen  DISPOSITION OF SPECIMEN:  N/A  COUNTS:  YES  TOURNIQUET:  * No tourniquets in log *  DICTATION: Patient is a 59 year old with severe spondylosis stenosis and scoliosis of the lumbar spine. It was elected to take her to surgery for anterolateral decompression and posterior pedicle screw fixation.  She has significant lateral spondylolisthesis at each level with coronal deformity and Cobb Angle of 20 degrees.  Procedure: Patient was brought to the operating room and placed in a right lateral decubitus position on the operative table and using orthogonally projected C-arm fluoroscopy the patient was placed so that the L2-3 L3-4 and L4-5 levels were visualized in AP and lateral plane. The patient was then taped into position. The table was flexed so as to expose the L4-5 level as the patient has a  high iliac crest. Skin was marked along with a posterior finger dissection incision. His flank was then prepped and draped in usual sterile fashion and incisions were made sequentially at L4-5 L3-4 and L2-3 and L 12 levels. Posterior finger dissection was made to enter the retroperitoneal space and then subsequently the probe was inserted into the psoas muscle from the left side initially at the L4-5 level. After mapping the neural elements were able to dock the probe per the midpoint of this vertebral level and without indications electrically of too close proximity to the neural tissues. Subsequently the self-retaining tractor was.after sequential dilators were utilized the shim was employed and the interspace was cleared of psoas muscle and then incised. A thorough discectomy was performed.  Instruments were used to clear the interspace of disc material. After thorough discectomy was performed and this was performed using AP and lateral fluoroscopy a 10 lordotic by 55 x 22 mm implant was packed with Osteocell Plus. This was tamped into position using the slides and its position was confirmed on AP and lateral fluoroscopy. Subsequently exposure was performed at the L3-4 level and similar dissection was performed with locking of the self-retaining retractor. At this level were able to place a 10 lordotic by 22 x 50 mm implant packed in a similar fashion. At the L2-3 level were able to place a 12 Lordotic x 22 x 45 mm implant and at L 12 a 10 Lordotic x 18 x 45 mm implant packed in a similar fashion. Hemostasis was assured the wounds were irrigated interrupted Vicryl sutures.  Patient was then turned into a prone position on the operating table on chest rolls and using AP and lateral fluoroscopy throughout this portion of  the procedure, pedicle screws were placed using Nuvasive cannulated percutaneous screws. 2 screws were placed at L1 and (5.5 x 45 mm) and 2 at L2 of similar size, two at L 3 ((6.5 x 50),  two at  L4 (6.5 x 45) and 2 at L5 (6.5 x 50). 140 mm rods were then affixed to the screw heads do a separate stab incision and locked down on the screws. All connections were then torqued and the Towers were disassembled. The wounds were irrigated and then closed with 1, 2-0 and 3-0 Vicryl stitches. Sterile occlusive dressing was placed with Dermabond. The patient was then extubated in the operating room and taken to recovery in stable and satisfactory condition having tolerated his operation well. Counts were correct at the end of the case.   PLAN OF CARE: Admit to inpatient   PATIENT DISPOSITION:  PACU - hemodynamically stable.   Delay start of Pharmacological VTE agent (>24hrs) due to surgical blood loss or risk of bleeding: yes

## 2014-03-10 NOTE — Interval H&P Note (Signed)
History and Physical Interval Note:  03/10/2014 7:15 AM  Robyn Smith  has presented today for surgery, with the diagnosis of Radiculopathy, Lumbar region; Spinal stenosis, Lumbar region; Spondylolisthesis, Lumbar region; Scoliosis and kyphoscoliosis, Idiopathic  The various methods of treatment have been discussed with the patient and family. After consideration of risks, benefits and other options for treatment, the patient has consented to  Procedure(s) with comments: Left L1-2 L2-3 L3-4 L4-5 Anterior lateral lumbar fusion with percutaneous pedicle screws and rods (Left) - Left L1-2 L2-3 L3-4 L4-5 Anterior lateral lumbar fusion with percutaneous pedicle screws and rods LUMBAR PERCUTANEOUS PEDICLE SCREW 4 LEVEL (Left) as a surgical intervention .  The patient's history has been reviewed, patient examined, no change in status, stable for surgery.  I have reviewed the patient's chart and labs.  Questions were answered to the patient's satisfaction.     Robyn Smith D

## 2014-03-10 NOTE — Anesthesia Preprocedure Evaluation (Addendum)
Anesthesia Evaluation  Patient identified by MRN, date of birth, ID band Patient awake    Reviewed: Allergy & Precautions, NPO status , Patient's Chart, lab work & pertinent test results  History of Anesthesia Complications (+) PONV  Airway Mallampati: II  TM Distance: >3 FB Neck ROM: Full    Dental  (+) Teeth Intact, Dental Advisory Given   Pulmonary pneumonia -, resolved,  breath sounds clear to auscultation        Cardiovascular + Peripheral Vascular Disease Rhythm:Regular Rate:Normal     Neuro/Psych Anxiety  Neuromuscular disease    GI/Hepatic Neg liver ROS, GERD-  ,  Endo/Other  negative endocrine ROS  Renal/GU negative Renal ROS     Musculoskeletal  (+) Arthritis -, Fibromyalgia -  Abdominal   Peds  Hematology  (+) anemia ,   Anesthesia Other Findings   Reproductive/Obstetrics                           Anesthesia Physical Anesthesia Plan  ASA: III  Anesthesia Plan: General   Post-op Pain Management:    Induction: Intravenous  Airway Management Planned: Oral ETT  Additional Equipment:   Intra-op Plan:   Post-operative Plan: Possible Post-op intubation/ventilation  Informed Consent: I have reviewed the patients History and Physical, chart, labs and discussed the procedure including the risks, benefits and alternatives for the proposed anesthesia with the patient or authorized representative who has indicated his/her understanding and acceptance.   Dental advisory given  Plan Discussed with: CRNA and Anesthesiologist  Anesthesia Plan Comments:        Anesthesia Quick Evaluation

## 2014-03-10 NOTE — Transfer of Care (Signed)
Immediate Anesthesia Transfer of Care Note  Patient: Robyn BryantLinda T. Smith  Procedure(s) Performed: Procedure(s) with comments: Left Lumbar Ome-Two,Lumbar Two-three,Lumbar Three-four,Lumbar four-Five Anterior lateral lumbar fusion with percutaneous pedicle screws and rods (Left) - left side approach LUMBAR PERCUTANEOUS PEDICLE SCREW LUMBAR ONE-TWO,LUMBAR TWO-THREE,LUMBARTHREE-FOUR,LUMBAR FOUR-FIVE (Left)  Patient Location: PACU  Anesthesia Type:General  Level of Consciousness: awake, alert  and oriented  Airway & Oxygen Therapy: Patient Spontanous Breathing and Patient connected to nasal cannula oxygen  Post-op Assessment: Report given to RN, Post -op Vital signs reviewed and stable and Patient moving all extremities X 4  Post vital signs: Reviewed and stable  Last Vitals:  Filed Vitals:   03/10/14 0600  BP: 99/51  Pulse: 74  Temp: 36.1 C  Resp: 20    Complications: No apparent anesthesia complications

## 2014-03-10 NOTE — Anesthesia Postprocedure Evaluation (Signed)
  Anesthesia Post-op Note  Patient: Robyn BryantLinda T. Tejada  Procedure(s) Performed: Procedure(s) with comments: Left Lumbar Ome-Two,Lumbar Two-three,Lumbar Three-four,Lumbar four-Five Anterior lateral lumbar fusion with percutaneous pedicle screws and rods (Left) - left side approach LUMBAR PERCUTANEOUS PEDICLE SCREW LUMBAR ONE-TWO,LUMBAR TWO-THREE,LUMBARTHREE-FOUR,LUMBAR FOUR-FIVE (Left)  Patient Location: PACU  Anesthesia Type:General  Level of Consciousness: awake  Airway and Oxygen Therapy: Patient Spontanous Breathing  Post-op Pain: mild  Post-op Assessment: Post-op Vital signs reviewed  Post-op Vital Signs: Reviewed  Last Vitals:  Filed Vitals:   03/10/14 1451  BP:   Pulse: 98  Temp:   Resp: 15    Complications: No apparent anesthesia complications

## 2014-03-10 NOTE — Progress Notes (Signed)
Report given to Ranald Alessio rn as caregiver 

## 2014-03-10 NOTE — Progress Notes (Signed)
ANTIBIOTIC CONSULT NOTE - INITIAL  Pharmacy Consult for vancomycin Indication: surgical prophylaxis  Allergies  Allergen Reactions  . Penicillins Other (See Comments)    As a child, doesn't know what type of reaction    Patient Measurements: Height: 5' 8.5" (174 cm) Weight: 190 lb 1 oz (86.212 kg) IBW/kg (Calculated) : 65.05 Vital Signs: Temp: 97.5 F (36.4 C) (03/01 1631) Temp Source: Oral (03/01 1631) BP: 130/69 mmHg (03/01 1631) Pulse Rate: 82 (03/01 1631) Intake/Output from this shift: Total I/O In: 3600 [I.V.:3600] Out: 1400 [Urine:1150; Blood:250] Labs: No results for input(s): WBC, HGB, PLT, LABCREA, CREATININE in the last 72 hours. Estimated Creatinine Clearance: 86.8 mL/min (by C-G formula based on Cr of 0.74). No results for input(s): VANCOTROUGH, VANCOPEAK, VANCORANDOM, GENTTROUGH, GENTPEAK, GENTRANDOM, TOBRATROUGH, TOBRAPEAK, TOBRARND, AMIKACINPEAK, AMIKACINTROU, AMIKACIN in the last 72 hours.   Microbiology: Recent Results (from the past 720 hour(s))  Surgical pcr screen     Status: Abnormal   Collection Time: 03/04/14  4:12 PM  Result Value Ref Range Status   MRSA, PCR NEGATIVE NEGATIVE Final   Staphylococcus aureus POSITIVE (A) NEGATIVE Final    Comment:        The Xpert SA Assay (FDA approved for NASAL specimens in patients over 60 years of age), is one component of a comprehensive surveillance program.  Test performance has been validated by [facility] for patients greater than or equal to 60 year old. It is not intended to diagnose infection nor to guide or monitor treatment.     Medical History: Past Medical History  Diagnosis Date  . GERD (gastroesophageal reflux disease)   . Peripheral vascular disease   . Pneumonia     as a child  . Anxiety   . Fibromyalgia   . Arthritis   . Cancer   . Anemia     prior to hysterectomy  . PONV (postoperative nausea and vomiting)     "some" not too much   Medications:  Anti-infectives    Start      Dose/Rate Route Frequency Ordered Stop   03/10/14 0608  vancomycin (VANCOCIN) 1 GM/200ML IVPB    Comments:  Shireen Quanodd, Robert   : cabinet override      03/10/14 [id number] 03/10/14 0825   03/10/14 0600  vancomycin (VANCOCIN) IVPB 1000 mg/200 mL premix  Status:  Discontinued     1,000 mg 200 mL/hr over 60 Minutes Intravenous On call to O.R. 03/09/14 1357 03/10/14 1616     Assessment: 59 year old female s/p lumbar fusion, allograft, and percutaneous pedicle screws and rods to receive vancomycin as post-operative surgical prophylaxis. Pre-operative dose was given at 0725 AM.  No drain in place -confirmed with RN and post-op note. Current CrCl~87 mL/min.   Goal of Therapy:  Vancomycin trough level 10-15 mcg/ml  Plan:  Vancomycin 1250mg  x1 dose 12 hours post pre-operative dose.  Monitor clinical status.   Link SnufferJessica Khush Pasion, PharmD, BCPS Clinical Pharmacist [phone] 03/10/2014,4:34 PM

## 2014-03-10 NOTE — Anesthesia Procedure Notes (Signed)
Procedure Name: Intubation Date/Time: 03/10/2014 7:43 AM Performed by: Reine JustFLOWERS, Elmore Hyslop T Pre-anesthesia Checklist: Patient identified, Emergency Drugs available, Suction available, Patient being monitored and Timeout performed Patient Re-evaluated:Patient Re-evaluated prior to inductionOxygen Delivery Method: Circle system utilized and Simple face mask Preoxygenation: Pre-oxygenation with 100% oxygen Intubation Type: IV induction Ventilation: Mask ventilation without difficulty Laryngoscope Size: Miller and 3 Grade View: Grade I Tube type: Oral Tube size: 7.5 mm Number of attempts: 1 Airway Equipment and Method: Patient positioned with wedge pillow and Stylet Placement Confirmation: ETT inserted through vocal cords under direct vision,  positive ETCO2 and breath sounds checked- equal and bilateral Secured at: 22 cm Tube secured with: Tape Dental Injury: Teeth and Oropharynx as per pre-operative assessment

## 2014-03-10 NOTE — Op Note (Signed)
03/10/2014  1:42 PM  PATIENT:  Robyn Smith  59 y.o. 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  POST-OPERATIVE DIAGNOSIS:  Radiculopathy,Lumbar region;Spinal stenosis,Lumbar region;Spondylolisthesis,lumbar region;Scoliosis and Kyphoscoliosis,Idiopathic L 12, L 23, L 34, L 45 levels  PROCEDURE:  Procedure(s) with comments: Left Lumbar One-Two,Lumbar Two-three,Lumbar Three-four,Lumbar four-Five Anterior lateral lumbar fusion with PEEK cages, allograft, percutaneous pedicle screws and rods  LUMBAR PERCUTANEOUS PEDICLE SCREW LUMBAR ONE-TWO,LUMBAR TWO-THREE,LUMBARTHREE-FOUR,LUMBAR FOUR-FIVE   SURGEON:  Surgeon(s) and Role:    * Avonna Iribe, MD - Primary    * Randy O Kritzer, MD - Assisting  PHYSICIAN ASSISTANT:   ASSISTANTS: Poteat, RN   ANESTHESIA:   general  EBL:  Total I/O In: 3600 [I.V.:3600] Out: 1250 [Urine:1000; Blood:250]  BLOOD ADMINISTERED:none  DRAINS: none   LOCAL MEDICATIONS USED:  LIDOCAINE   SPECIMEN:  No Specimen  DISPOSITION OF SPECIMEN:  N/A  COUNTS:  YES  TOURNIQUET:  * No tourniquets in log *  DICTATION: Patient is a 59-year-old with severe spondylosis stenosis and scoliosis of the lumbar spine. It was elected to take her to surgery for anterolateral decompression and posterior pedicle screw fixation.  She has significant lateral spondylolisthesis at each level with coronal deformity and Cobb Angle of 20 degrees.  Procedure: Patient was brought to the operating room and placed in a right lateral decubitus position on the operative table and using orthogonally projected C-arm fluoroscopy the patient was placed so that the L2-3 L3-4 and L4-5 levels were visualized in AP and lateral plane. The patient was then taped into position. The table was flexed so as to expose the L4-5 level as the patient has a  high iliac crest. Skin was marked along with a posterior finger dissection incision. His flank was then prepped and draped in usual sterile fashion and incisions were made sequentially at L4-5 L3-4 and L2-3 and L 12 levels. Posterior finger dissection was made to enter the retroperitoneal space and then subsequently the probe was inserted into the psoas muscle from the left side initially at the L4-5 level. After mapping the neural elements were able to dock the probe per the midpoint of this vertebral level and without indications electrically of too close proximity to the neural tissues. Subsequently the self-retaining tractor was.after sequential dilators were utilized the shim was employed and the interspace was cleared of psoas muscle and then incised. A thorough discectomy was performed.  Instruments were used to clear the interspace of disc material. After thorough discectomy was performed and this was performed using AP and lateral fluoroscopy a 10 lordotic by 55 x 22 mm implant was packed with Osteocell Plus. This was tamped into position using the slides and its position was confirmed on AP and lateral fluoroscopy. Subsequently exposure was performed at the L3-4 level and similar dissection was performed with locking of the self-retaining retractor. At this level were able to place a 10 lordotic by 22 x 50 mm implant packed in a similar fashion. At the L2-3 level were able to place a 12 Lordotic x 22 x 45 mm implant and at L 12 a 10 Lordotic x 18 x 45 mm implant packed in a similar fashion. Hemostasis was assured the wounds were irrigated interrupted Vicryl sutures.  Patient was then turned into a prone position on the operating table on chest rolls and using AP and lateral fluoroscopy throughout this portion of   the procedure, pedicle screws were placed using Nuvasive cannulated percutaneous screws. 2 screws were placed at L1 and (5.5 x 45 mm) and 2 at L2 of similar size, two at L 3 ((6.5 x 50),  two at  L4 (6.5 x 45) and 2 at L5 (6.5 x 50). 140 mm rods were then affixed to the screw heads do a separate stab incision and locked down on the screws. All connections were then torqued and the Towers were disassembled. The wounds were irrigated and then closed with 1, 2-0 and 3-0 Vicryl stitches. Sterile occlusive dressing was placed with Dermabond. The patient was then extubated in the operating room and taken to recovery in stable and satisfactory condition having tolerated his operation well. Counts were correct at the end of the case.   PLAN OF CARE: Admit to inpatient   PATIENT DISPOSITION:  PACU - hemodynamically stable.   Delay start of Pharmacological VTE agent (>24hrs) due to surgical blood loss or risk of bleeding: yes  

## 2014-03-10 NOTE — Progress Notes (Signed)
Awake, alert, conversant.  MAEW with good strength bilateral PF/DF.   Back sore.  Doing well.

## 2014-03-11 ENCOUNTER — Encounter (HOSPITAL_COMMUNITY): Payer: Self-pay | Admitting: Neurosurgery

## 2014-03-11 MED FILL — Sodium Chloride IV Soln 0.9%: INTRAVENOUS | Qty: 1000 | Status: AC

## 2014-03-11 MED FILL — Heparin Sodium (Porcine) Inj 1000 Unit/ML: INTRAMUSCULAR | Qty: 30 | Status: AC

## 2014-03-11 NOTE — Evaluation (Signed)
Occupational Therapy Evaluation Patient Details Name: Robyn BryantLinda T. [name] MRN: [id number] DOB: March 08, 1955 Today's Date: 03/11/2014    History of Present Illness s/p ALIF L1-2, L3-4, L4-5. PMH: anxiety, PVD, GERD, cancer, fibromyalgia.   Clinical Impression   Pt admitted with the above diagnoses and presents with below problem list. Pt will benefit from continued acute OT to address the below listed deficits and maximize independence with basic ADLs prior to d/c home with family. PTA pt was independent with ADLs. Pt currently at min A level for LB ADLs, bed mobility and functional transfers/mobility. Acute OT to follow.      Follow Up Recommendations  No OT follow up;Supervision/Assistance - 24 hour    Equipment Recommendations  3 in 1 bedside comode    Recommendations for Other Services       Precautions / Restrictions Precautions Precautions: Back Precaution Booklet Issued: Yes (comment) Precaution Comments: reviewed back precautions Required Braces or Orthoses: Spinal Brace Spinal Brace: Lumbar corset;Applied in sitting position Restrictions Weight Bearing Restrictions: No      Mobility Bed Mobility Overal bed mobility: Needs Assistance Bed Mobility: Rolling;Sidelying to Sit;Sit to Sidelying Rolling: Min assist Sidelying to sit: Min assist     Sit to sidelying: Min assist General bed mobility comments: min A to maintain precautions and to power up trunk supine> sitting and advance LLE in sit > sidelying  Transfers Overall transfer level: Needs assistance Equipment used: Rolling walker (2 wheeled) Transfers: Sit to/from Stand Sit to Stand: Min assist         General transfer comment: min A for balance and to control descent, practiced from elevated bed height and from 3n1 over toilet; cues for hand placement with rw    Balance Overall balance assessment: Needs assistance Sitting-balance support:      Standing balance support: Bilateral upper extremity  supported;During functional activity Standing balance-Leahy Scale: Poor Standing balance comment: needs support for standing balance                            ADL Overall ADL's : Needs assistance/impaired Eating/Feeding: Set up;Sitting   Grooming: Set up;Standing;Oral care   Upper Body Bathing: Sitting;Set up   Lower Body Bathing: Sit to/from stand;Minimal assistance   Upper Body Dressing : Set up;Sitting   Lower Body Dressing: Minimal assistance;Sit to/from stand   Toilet Transfer: Minimal assistance;Ambulation;RW (3n1 over toilet)   Toileting- Clothing Manipulation and Hygiene: Minimal assistance;Sit to/from stand   Tub/ Shower Transfer: Minimal assistance;Ambulation;Rolling walker;3 in 1   Functional mobility during ADLs: Minimal assistance;Rolling walker General ADL Comments: min A for balance and to control descent impacting level of assist with ADLs. Educated pt and spouse on techniques and AE for completion of ADLs with back precautions. Reviewed home set up safety (remove throw rugs, put bag on rw, etc.)     Vision     Perception     Praxis      Pertinent Vitals/Pain Pain Assessment: 0-10 Pain Score: 8  Pain Location: back Pain Descriptors / Indicators: Sore Pain Intervention(s): Limited activity within patient's tolerance;Monitored during session;Repositioned;Utilized relaxation techniques;Other (comment) (6/10 at end of session)     Hand Dominance Right   Extremity/Trunk Assessment Upper Extremity Assessment Upper Extremity Assessment: Overall WFL for tasks assessed   Lower Extremity Assessment Lower Extremity Assessment: Defer to PT evaluation LLE Deficits / Details:   Cervical / Trunk Assessment Cervical / Trunk Assessment:    Communication Communication Communication: No difficulties  Cognition Arousal/Alertness: Awake/alert Behavior During Therapy: WFL for tasks assessed/performed Overall Cognitive Status: Within Functional  Limits for tasks assessed                     General Comments       Exercises       Shoulder Instructions      Home Living Family/patient expects to be discharged to:: Private residence Living Arrangements: Spouse/significant other Available Help at Discharge: Family;Available 24 hours/day Type of Home: House Home Access: Stairs to enter [employer] of Steps: 8 Entrance Stairs-Rails: Right;Left;None Home Layout: Laundry or work area in basement;Two level Alternate Level Stairs-Number of Steps: flight Alternate Level Stairs-Rails: Right Bathroom Shower/Tub: Producer, television/film/video: Standard Bathroom Accessibility: Yes How Accessible: Accessible via walker Home Equipment: Walker - 2 wheels          Prior Functioning/Environment Level of Independence: Independent        Comments: was hauling wood for her wood stove, doing all daily activities    OT Diagnosis: Acute pain   OT Problem List: Impaired balance (sitting and/or standing);Decreased knowledge of use of DME or AE;Decreased knowledge of precautions;Pain   OT Treatment/Interventions: Self-care/ADL training;DME and/or AE instruction;Therapeutic activities;Patient/family education;Balance training    OT Goals(Current goals can be found in the care plan section) Acute Rehab OT Goals Patient Stated Goal: not stated OT Goal Formulation: With patient/family Time For Goal Achievement: 03/18/14 Potential to Achieve Goals: Good ADL Goals Pt Will Perform Lower Body Bathing: with modified independence;with adaptive equipment;sit to/from stand Pt Will Perform Lower Body Dressing: with modified independence;with adaptive equipment;sit to/from stand Pt Will Perform Tub/Shower Transfer: with supervision;ambulating;3 in 1;rolling walker Additional ADL Goal #1: Pt will perform logrolling technique at mod I level to prepare for OOB ADLs.  OT Frequency: Min 2X/week   Barriers to D/C:             Co-evaluation              End of Session Equipment Utilized During Treatment: Gait belt;Rolling walker;Back brace  Activity Tolerance: Patient tolerated treatment well;Patient limited by pain Patient left: in bed;with call bell/phone within reach;with bed alarm set;with family/visitor present;with SCD's reapplied   Time: 1335-1407 OT Time Calculation (min): 32 min Charges:  OT General Charges $OT Visit: 1 Procedure OT Evaluation $Initial OT Evaluation Tier I: 1 Procedure OT Treatments $Self Care/Home Management : 8-22 mins G-Codes:    Pilar Grammes 03-26-14, 2:30 PM

## 2014-03-11 NOTE — Progress Notes (Signed)
CARE MANAGEMENT NOTE 03/11/2014  Patient:  Robyn Smith,Robyn Smith.   Account Number:  [id number]  Date Initiated:  03/11/2014  Documentation initiated by:  Jiles CrockerHANDLER,Beaulah Romanek  Subjective/Objective Assessment:   ADMITTED FOR SURGERY     Action/Plan:   CM FOLLOWING FOR DCP   Anticipated DC Date:  03/14/2014   Anticipated DC Plan:  POSSIBLY HOME W HOME HEALTH SERVICES, AWAITING ON PT/OT EVALS FOR DISPOSITION NEEDS     DC Planning Services  CM consult        Status of service:  In process, will continue to follow  Per UR Regulation:  Reviewed for med. necessity/level of care/duration of stay  Comments:  3/2/2016Abelino Derrick- B Denson Niccoli RN,BSN,MHA [phone]

## 2014-03-11 NOTE — Clinical Social Work Note (Signed)
CSW Consult Acknowledged:   CSW received a consult for SNF placement. CSW awaiting PT/OT evaluation to determine the appropriate level of care.      Robyn Smith, MSW, LCSWA [phone]  

## 2014-03-11 NOTE — Progress Notes (Signed)
Subjective: Patient reports "My leg pain is gone. My back is really tight, though"  Objective: Vital signs in last 24 hours: Temp:  [96.8 F (36 C)-97.8 F (36.6 C)] 97.8 F (36.6 C) (03/02 0116) Pulse Rate:  [71-110] 77 (03/02 0116) Resp:  [10-20] 16 (03/02 0116) BP: (123-147)/(55-73) 134/69 mmHg (03/02 0116) SpO2:  [97 %-100 %] 100 % (03/02 0116) Arterial Line BP: (131-161)/(67-74) 159/71 mmHg (03/01 1445)  Intake/Output from previous day: 03/01 0701 - 03/02 0700 In: 3886.3 [P.O.:60; I.V.:3826.3] Out: 3400 [Urine:3150; Blood:250] Intake/Output this shift:    Alert, conversant, sitting in chair. Repoorts no RLE pain as pre-op, but lumbar pain & spasm. Incisions with Dermabond & honeycomb drsgs. No erythema, swelling, or drainage.  Good strength BLE.   Lab Results: No results for input(s): WBC, HGB, HCT, PLT in the last 72 hours. BMET No results for input(s): NA, K, CL, CO2, GLUCOSE, BUN, CREATININE, CALCIUM in the last 72 hours.  Studies/Results: Dg Lumbar Spine 2-3 Views  03/10/2014   CLINICAL DATA:  Posterior fusion of L1 through L5.  EXAM: DG C-ARM GT 120 MIN; LUMBAR SPINE - 2-3 VIEW  TECHNIQUE: Three intraoperative fluoroscopic images of the lumbar spine were obtained.  CONTRAST:  None.  FLUOROSCOPY TIME:  Radiation Exposure Index (as provided by the fluoroscopic device): Not given.  If the device does not provide the exposure index:  Fluoroscopy Time (in minutes and seconds):  8 minutes 2 seconds.  Number of Acquired Images:  3  COMPARISON:  February 11, 2014.  FINDINGS: These images demonstrate posterior fusion of L1 through L5 with bilateral intrapedicular screw placement and interbody fusion. Good alignment of vertebral bodies is noted.  IMPRESSION: Status post surgical posterior fusion of lumbar spine.   Electronically Signed   By: Lupita RaiderJames  Green Jr, M.D.   On: 03/10/2014 14:13   Dg C-arm Gt 120 Min  03/10/2014   CLINICAL DATA:  Posterior fusion of L1 through L5.  EXAM: DG  C-ARM GT 120 MIN; LUMBAR SPINE - 2-3 VIEW  TECHNIQUE: Three intraoperative fluoroscopic images of the lumbar spine were obtained.  CONTRAST:  None.  FLUOROSCOPY TIME:  Radiation Exposure Index (as provided by the fluoroscopic device): Not given.  If the device does not provide the exposure index:  Fluoroscopy Time (in minutes and seconds):  8 minutes 2 seconds.  Number of Acquired Images:  3  COMPARISON:  February 11, 2014.  FINDINGS: These images demonstrate posterior fusion of L1 through L5 with bilateral intrapedicular screw placement and interbody fusion. Good alignment of vertebral bodies is noted.  IMPRESSION: Status post surgical posterior fusion of lumbar spine.   Electronically Signed   By: Lupita RaiderJames  Green Jr, M.D.   On: 03/10/2014 14:13    Assessment/Plan: Improving   LOS: 1 day  Mobilize in LSO with PT. Pt is hopeful of d/c to home tomorrow. Will evaluate her progress with PT today.   Georgiann Cockeroteat, Brian 03/11/2014, 8:07 AM    Patient is doing well following lumbar decompression and fusion surgery.

## 2014-03-11 NOTE — Evaluation (Signed)
Physical Therapy Evaluation Patient Details Name: Robyn BryantLinda T. [name] MRN: [id number] DOB: November 26, 1955 Today's Date: 03/11/2014   History of Present Illness  Pt presents for ALIF L1-2, L3-4, L4-5. PMH: anxiety, PVD, GERD, cancer, fibromyalgia.  Clinical Impression  Pt admitted with above diagnosis. Pt currently with functional limitations due to the deficits listed below (see PT Problem List). Pt with increased back pain with all mobility and decreased strength LLE <3/5, she reports that this is new since surgery.  Pt will benefit from skilled PT to increase their independence and safety with mobility to allow discharge to the venue listed below.       Follow Up Recommendations Home health PT;Supervision for mobility/OOB    Equipment Recommendations  3in1 (PT)    Recommendations for Other Services OT consult     Precautions / Restrictions Precautions Precautions: Back Precaution Booklet Issued: Yes (comment) Precaution Comments: reviewed back precautions Required Braces or Orthoses: Spinal Brace Spinal Brace: Lumbar corset;Applied in sitting position Restrictions Weight Bearing Restrictions: No      Mobility  Bed Mobility                  Transfers Overall transfer level: Needs assistance Equipment used: Rolling walker (2 wheeled) Transfers: Sit to/from Stand Sit to Stand: Min assist         General transfer comment: min A to and from toilet, pt has diffiuclty lowering herself down, min-guard A to bed, vc's for hand placement  Ambulation/Gait Ambulation/Gait assistance: Min assist Ambulation Distance (Feet): 40 Feet Assistive device: Rolling walker (2 wheeled) Gait Pattern/deviations: Step-through pattern;Decreased stride length;Decreased weight shift to left;Decreased step length - left;Decreased stance time - left;Antalgic Gait velocity: decreased Gait velocity interpretation: Below normal speed for age/gender General Gait Details: increased pain with  ambulation, pt having difficulty advancing left leg but left knee not buckling  Stairs            Wheelchair Mobility    Modified Rankin (Stroke Patients Only)       Balance Overall balance assessment: Needs assistance Sitting-balance support: No upper extremity supported;Feet supported Sitting balance-Leahy Scale: Good     Standing balance support: Bilateral upper extremity supported;During functional activity Standing balance-Leahy Scale: Poor Standing balance comment: pt unable to stand without support today due to pain                             Pertinent Vitals/Pain Pain Assessment: 0-10 Pain Score: 8  Pain Location: back Pain Intervention(s): Monitored during session;Patient requesting pain meds-RN notified    Home Living Family/patient expects to be discharged to:: Private residence Living Arrangements: Spouse/significant other Available Help at Discharge: Family;Available 24 hours/day Type of Home: House Home Access: Stairs to enter Entrance Stairs-Rails: Right;Left;None Entrance Stairs-Number of Steps: 8 Home Layout: Laundry or work area in basement;Two level Home Equipment: Walker - 2 wheels      Prior Function Level of Independence: Independent         Comments: was hauling wood for her wood stove, doing all daily activities     Hand Dominance   Dominant Hand: Right    Extremity/Trunk Assessment   Upper Extremity Assessment: Overall WFL for tasks assessed           Lower Extremity Assessment: LLE deficits/detail   LLE Deficits / Details: hip flex 2/5, knee ext 2/5, knee flex 2/5, hip abd 2/5  Cervical / Trunk Assessment: Normal  Communication   Communication: No difficulties  Cognition Arousal/Alertness: Awake/alert Behavior During Therapy: WFL for tasks assessed/performed Overall Cognitive Status: Within Functional Limits for tasks assessed                      General Comments      Exercises         Assessment/Plan    PT Assessment Patient needs continued PT services  PT Diagnosis Difficulty walking;Abnormality of gait;Acute pain   PT Problem List Decreased strength;Decreased activity tolerance;Decreased range of motion;Decreased balance;Decreased mobility;Decreased knowledge of use of DME;Decreased knowledge of precautions;Pain  PT Treatment Interventions DME instruction;Gait training;Stair training;Functional mobility training;Therapeutic activities;Therapeutic exercise;Balance training;Neuromuscular re-education;Patient/family education   PT Goals (Current goals can be found in the Care Plan section) Acute Rehab PT Goals Patient Stated Goal: return home PT Goal Formulation: With patient Time For Goal Achievement: 03/18/14 Potential to Achieve Goals: Good    Frequency Min 5X/week   Barriers to discharge        Co-evaluation               End of Session Equipment Utilized During Treatment: Gait belt;Back brace Activity Tolerance: Patient limited by pain Patient left: in bed;with call bell/phone within reach;with bed alarm set Nurse Communication: Mobility status;Patient requests pain meds         Time: 1116-1200 PT Time Calculation (min) (ACUTE ONLY): 44 min   Charges:   PT Evaluation $Initial PT Evaluation Tier I: 1 Procedure PT Treatments $Gait Training: 8-22 mins $Therapeutic Activity: 8-22 mins   PT G Codes:      Lyanne Co, PT  Acute Rehab Services  [phone]   Pecola Leisure, [place] 03/11/2014, 12:11 PM

## 2014-03-12 ENCOUNTER — Encounter (HOSPITAL_COMMUNITY): Payer: Self-pay | Admitting: Neurosurgery

## 2014-03-12 MED ORDER — PANTOPRAZOLE SODIUM 40 MG PO TBEC
40.0000 mg | DELAYED_RELEASE_TABLET | Freq: Every day | ORAL | Status: DC
  Administered 2014-03-12: 40 mg via ORAL
  Filled 2014-03-12: qty 1

## 2014-03-12 NOTE — Progress Notes (Signed)
Physical Therapy Treatment Patient Details Name: Robyn Smith. Barreira MRN: [id number] DOB: [birth year] Today's Date: 03/12/2014    History of Present Illness s/p ALIF L1-2, L3-4, L4-5. PMH: anxiety, PVD, GERD, cancer, fibromyalgia.    PT Comments    Patient continues to be limited by L knee pain in standing and with ambulation. Patient stated that she received pain meds earlier but they have worn off and that L knee pain was worse today than on eval. Patient will need to work on increasing endurance and mobility prior to returning home. Continue with current POC  Follow Up Recommendations  Home health PT;Supervision for mobility/OOB     Equipment Recommendations  3in1 (PT)    Recommendations for Other Services       Precautions / Restrictions Precautions Precaution Comments: reviewed back precautions Required Braces or Orthoses: Spinal Brace Spinal Brace: Lumbar corset;Applied in sitting position    Mobility  Bed Mobility               General bed mobility comments: Patient up in recliner before and after session  Transfers Overall transfer level: Needs assistance Equipment used: Rolling walker (2 wheeled)   Sit to Stand: Min guard         General transfer comment: Cues for correct positioning and safe hand placement. Minguard for safety   Ambulation/Gait Ambulation/Gait assistance: Min guard Ambulation Distance (Feet): 35 Feet Assistive device: Rolling walker (2 wheeled) Gait Pattern/deviations: Step-through pattern;Decreased stride length;Decreased step length - left;Decreased step length - right Gait velocity: decreased Gait velocity interpretation: Below normal speed for age/gender General Gait Details: increased pain with ambulation, pt having difficulty advancing left leg but left knee not buckling. Cues to increase weight bearing onto R side as needed. Patient stated L knee pain is worse today   Stairs            Wheelchair Mobility    Modified  Rankin (Stroke Patients Only)       Balance                                    Cognition Arousal/Alertness: Awake/alert Behavior During Therapy: WFL for tasks assessed/performed Overall Cognitive Status: Within Functional Limits for tasks assessed                      Exercises      General Comments        Pertinent Vitals/Pain Pain Score: 8  Pain Location: back and L knee Pain Descriptors / Indicators: Sore;Shooting Pain Intervention(s): Monitored during session;Repositioned    Home Living                      Prior Function            PT Goals (current goals can now be found in the care plan section) Progress towards PT goals: Progressing toward goals    Frequency  Min 5X/week    PT Plan Current plan remains appropriate    Co-evaluation             End of Session Equipment Utilized During Treatment: Back brace Activity Tolerance: Patient tolerated treatment well Patient left: in chair;with call bell/phone within reach;with chair alarm set     Time: 4098-1191 PT Time Calculation (min) (ACUTE ONLY): 16 min  Charges:  $Gait Training: 8-22 mins  G Codes:      [name], Robyn Smith 03/12/2014, 10:22 AM 03/12/2014 Fredrich Birksobinette, [name] PTA [phone] pager [phone] office

## 2014-03-12 NOTE — Clinical Social Work Note (Signed)
CSW reviewed chart per PT evaluation the appropriate level of care is Home Health PT .  CSW will sign off.       Miosotis Wetsel, MSW, LCSWA [phone]

## 2014-03-12 NOTE — Progress Notes (Signed)
Subjective: Patient reports "I had a rough night. This knee is giving me a fit, too"  Objective: Vital signs in last 24 hours: Temp:  [98.3 F (36.8 C)-99.5 F (37.5 C)] 98.6 F (37 C) (03/03 0458) Pulse Rate:  [95-100] 99 (03/03 0458) Resp:  [16-18] 18 (03/03 0458) BP: (110-122)/(50-56) 119/56 mmHg (03/03 0458) SpO2:  [95 %-100 %] 100 % (03/03 0458)  Intake/Output from previous day:   Intake/Output this shift:    Sitting iin chair, conversant.  Apprehensive regarding d/c to home today.  Not yet advanced beyond liquid diet. Nausea yesterday. Incisions without erythema, swelling, or drainage. Good strength BLE. Some left hip flexor difficulty due to irritated psoas with lateral surgical access - not unexpected.   Lab Results: No results for input(s): WBC, HGB, HCT, PLT in the last 72 hours. BMET No results for input(s): NA, K, CL, CO2, GLUCOSE, BUN, CREATININE, CALCIUM in the last 72 hours.  Studies/Results: Dg Lumbar Spine 2-3 Views  03/10/2014   CLINICAL DATA:  Posterior fusion of L1 through L5.  EXAM: DG C-ARM GT 120 MIN; LUMBAR SPINE - 2-3 VIEW  TECHNIQUE: Three intraoperative fluoroscopic images of the lumbar spine were obtained.  CONTRAST:  None.  FLUOROSCOPY TIME:  Radiation Exposure Index (as provided by the fluoroscopic device): Not given.  If the device does not provide the exposure index:  Fluoroscopy Time (in minutes and seconds):  8 minutes 2 seconds.  Number of Acquired Images:  3  COMPARISON:  February 11, 2014.  FINDINGS: These images demonstrate posterior fusion of L1 through L5 with bilateral intrapedicular screw placement and interbody fusion. Good alignment of vertebral bodies is noted.  IMPRESSION: Status post surgical posterior fusion of lumbar spine.   Electronically Signed   By: Lupita RaiderJames  Green Jr, M.D.   On: 03/10/2014 14:13   Dg C-arm Gt 120 Min  03/10/2014   CLINICAL DATA:  Posterior fusion of L1 through L5.  EXAM: DG C-ARM GT 120 MIN; LUMBAR SPINE - 2-3 VIEW   TECHNIQUE: Three intraoperative fluoroscopic images of the lumbar spine were obtained.  CONTRAST:  None.  FLUOROSCOPY TIME:  Radiation Exposure Index (as provided by the fluoroscopic device): Not given.  If the device does not provide the exposure index:  Fluoroscopy Time (in minutes and seconds):  8 minutes 2 seconds.  Number of Acquired Images:  3  COMPARISON:  February 11, 2014.  FINDINGS: These images demonstrate posterior fusion of L1 through L5 with bilateral intrapedicular screw placement and interbody fusion. Good alignment of vertebral bodies is noted.  IMPRESSION: Status post surgical posterior fusion of lumbar spine.   Electronically Signed   By: Lupita RaiderJames  Green Jr, M.D.   On: 03/10/2014 14:13    Assessment/Plan: Improving   LOS: 2 days  Per DrStern, advance diet, mobilize in LSO. Will reassess this afternoon for disposition.    Robyn Smith, Robyn Smith 03/12/2014, 9:42 AM

## 2014-03-12 NOTE — Progress Notes (Signed)
Talked to patient about DCP/ HHC choices, patient lives in [place], [place] and [facility]; [facility]; DME 3:1 ordered as requestedAlexis Goodell; B Nickolai Rinks RN,BSN,MHA [phone]

## 2014-03-13 NOTE — Progress Notes (Signed)
Discharge instructions reviewed with patient/family. All questions answered at this time. RXs given to patient. Transport by family.   Sim BoastHavy, RN

## 2014-03-13 NOTE — Progress Notes (Signed)
Patient ID: Robyn Smith, female   DOB: 1955/09/12, 59 y.o.   MRN: [id number] Spoke with Dr. Nilsa Nuttinglin's office in hopes of room visit if MD/PA nearby today or office visit after discharge. Office advised no availability today & pt should call for appt once home and settled. Will convey to pt and proceed with d/c to home.   Georgiann CockerBrian Evadene Wardrip RN BSN

## 2014-03-13 NOTE — Progress Notes (Signed)
Patient voice concern r/t outpatient ortho consult. Per MD this AM that patient will need to set up appoint per self. RN relayed information. Patient acknowledge and will set up ortho consult once return to residence.  Sim BoastHavy, RN

## 2014-03-13 NOTE — Progress Notes (Signed)
Occupational Therapy Treatment Patient Details Name: Robyn Smith. Goodnow MRN: [id number] DOB: 04-04-1955 Today's Date: 03/13/2014    History of present illness s/p ALIF L1-2, L3-4, L4-5. PMH: anxiety, PVD, GERD, cancer, fibromyalgia.   OT comments  Pt still needs min assist overall for selfcare tasks and continues to exhibit moderate pain in both her back and left knee, which limit participation.  Feel she needs 24 hour supervision if discharged home today for safety.  Will continue to follow for therapy.  Follow Up Recommendations  Supervision/Assistance - 24 hour    Equipment Recommendations  3 in 1 bedside comode       Precautions / Restrictions Precautions Precautions: Back Precaution Booklet Issued: Yes (comment) Precaution Comments: reviewed back precautions Required Braces or Orthoses: Spinal Brace Spinal Brace: Lumbar corset;Applied in sitting position Restrictions Weight Bearing Restrictions: No       Mobility Bed Mobility   Bed Mobility: Sit to Supine Rolling: Min assist     Sit to supine: Min assist   General bed mobility comments: Pt required assistance with lifting LLE into the bed from sidelying position.  Transfers Overall transfer level: Needs assistance   Transfers: Sit to/from Stand Sit to Stand: Min assist              Balance     Sitting balance-Leahy Scale: Good     Standing balance support: Bilateral upper extremity supported Standing balance-Leahy Scale: Poor Standing balance comment: Pt needs UE support on the RW to maintain standing balance.                   ADL Overall ADL's : Needs assistance/impaired                     Lower Body Dressing: Minimal assistance;Adhering to back precautions;Sit to/from stand;With adaptive equipment Lower Body Dressing Details (indicate cue type and reason): Pt utilized reacher and sockaide for LB dressing. Toilet Transfer: Minimal assistance;Ambulation;RW;BSC   Toileting- Clothing  Manipulation and Hygiene: Minimal assistance;Sit to/from stand;Cueing for safety         General ADL Comments: Pt with increased pain in the left knee and back this am.  Received pain meds just prior to session but not much relief noted.  Educated pt on use of reacher and sockaide for [employer].  She was able to return demonstrate with min instructional cueing.  Also educated pt on where AE coud be purchased.  Therapist gave visual demonstration of how to perform walk-in shower transfer but pt did not practice this session secondary to increased pain.                  Cognition   Behavior During Therapy: WFL for tasks assessed/performed Overall Cognitive Status: Within Functional Limits for tasks assessed                                    Pertinent Vitals/ Pain       Pain Assessment: 0-10 Pain Score: 9  Pain Location: back and left knee Pain Intervention(s): Limited activity within patient's tolerance;Patient requesting pain meds-RN notified;Premedicated before session;Ice applied     Prior Functioning/Environment              Frequency Min 2X/week     Progress Toward Goals  OT Goals(current goals can now be found in the care plan section)  Progress towards OT goals: Not progressing toward goals -  comment (still limited by pain at this time, will continue to assess in next visit.)     Plan Discharge plan remains appropriate       End of Session Equipment Utilized During Treatment: Rolling walker;Back brace   Activity Tolerance Patient limited by pain   Patient Left in bed;with call bell/phone within reach;with nursing/sitter in room   Nurse Communication Mobility status;Patient requests pain meds        Time: [id number] OT Time Calculation (min): 40 min  Charges: OT General Charges $OT Visit: 1 Procedure OT Treatments $Self Care/Home Management : 38-52 mins  Alane Hanssen OTR/L 03/13/2014, 10:43 AM

## 2014-03-13 NOTE — Progress Notes (Signed)
PT Cancellation Note  Patient Details Name: Robyn Smith MRN: [id number] DOB: 1955/08/25   Cancelled Treatment:    Reason Eval/Treat Not Completed: Other (comment). Patient stated that she was having pain and was planning to DC today. Patient stated that she did not have any concerns about DCing home and doing two steps with assistance from her husband. Patient declined ambulation and stair, even with encouragement. Patient did work and walk with the OT earlier this morning. Please page with any further questions.    [name], [name] 03/13/2014, 1:09 PM

## 2014-03-13 NOTE — Discharge Summary (Signed)
Physician Discharge Summary  Patient ID: Robyn BryantLinda T. [name] MRN: [id number] DOB/AGE: 09/01/1955 59 y.o.  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 Following uncomplicated decompression and fusion, she transferred to [facility] for nursing care and therapies. She has progressed steadily, with recurrence of unrelated left knee pain and mild swelling today. She desires to change her orthopedic care to Dr. Charlann Boxerlin and will follow up with his office on an outpatient basis (as instructed by his office today).  Consults: None  Significant Diagnostic Studies: radiology:  X-Ray: intra-operative  Treatments: surgery: Left Lumbar One-Two,Lumbar Two-three,Lumbar Three-four,Lumbar four-Five Anterior lateral lumbar fusion with PEEK cages, allograft, percutaneous pedicle screws and rods  LUMBAR PERCUTANEOUS PEDICLE SCREW LUMBAR ONE-TWO,LUMBAR TWO-THREE,LUMBARTHREE-FOUR,LUMBAR FOUR-FIVE    Discharge Exam: Blood pressure 95/59, pulse 99, temperature 98.3 F (36.8 C), temperature source Oral, resp. rate 18, height 5' 8.5" (1.74 m), weight 86.212 kg (190 lb 1 oz), SpO2 100 %. Alert,  conversant, sitting in chair. Incisions without erythema, swelling, or drainage beneath honeycomb drsgs with Dermabond. good strength BLE. Nausea resolved & tolerating regular diet. Ambulation limited by left knee pain. Mild swelling/fluid left knee, reported by pt to be more significant than in past.   (She notes that her appt to see Dr. Charlann Boxerlin had to be cancelled to have her back surgery. She has seen Dr. Ronney AstersHermann in [place], but does not want to have surgery there if surgery is indicated.)   Disposition: Discharge to home. Rx's to chart for Percocet and Robaxin. She will stop Diclofenac.  Pt verbalized understanding of  d/c instructions and agrees to call office for 3-4 week follow up. She will contact Dr. Nilsa Nuttinglin's office as well for evaluation of longstanding left knee issues.      Medication List    STOP taking these medications        diclofenac 75 MG EC tablet  Commonly known as:  VOLTAREN      TAKE these medications        ALPRAZolam 0.25 MG tablet  Commonly known as:  XANAX  Take 0.25 mg by mouth 2 (two) times daily as needed for anxiety or sleep.     oxyCODONE-acetaminophen 10-325 MG per tablet  Commonly known as:  PERCOCET  Take 1 tablet by mouth every 6 (six) hours as needed for pain.           Follow-up Information    [facility].   Why:  they will provide your home health physical therapy tele # [phone]   Contact information:   [address] [place] [phone]       Signed: Georgiann Cockeroteat, Maghan Jessee 03/13/2014, 9:04 AM

## 2014-03-13 NOTE — Progress Notes (Signed)
Subjective: Patient reports "My back still feels tight, but this knee is what is hurting so bad."  Objective: Vital signs in last 24 hours: Temp:  [98 F (36.7 C)-98.8 F (37.1 C)] 98.3 F (36.8 C) (03/04 0538) Pulse Rate:  [76-100] 76 (03/04 0538) Resp:  [16-20] 18 (03/04 0538) BP: (102-118)/(47-97) 102/47 mmHg (03/04 0538) SpO2:  [95 %-99 %] 96 % (03/04 0538)  Intake/Output from previous day: 03/03 0701 - 03/04 0700 In: 480 [P.O.:480] Out: -  Intake/Output this shift:    Alert, conversant, sitting in chair. Incisions without erythema, swelling, or drainage beneath honeycomb drsgs with Dermabond. good strength BLE. Nausea resolved & tolerating regular diet. Ambulation limited by left knee pain. Mild swelling/fluid left knee, reported by pt to be more significant than in past.  (She notes that her appt to see Dr. Charlann Boxerlin had to be cancelled to have her back surgery. She has seen Dr. Ron[place] AstersHermann in [place], but does not want to have surgery there if surgery is indicated.)  Lab Results: No results for input(s): WBC, HGB, HCT, PLT in the last 72 hours. BMET No results for input(s): NA, K, CL, CO2, GLUCOSE, BUN, CREATININE, CALCIUM in the last 72 hours.  Studies/Results: No results found.  Assessment/Plan: Improving   LOS: 3 days  Improving from standpoint of lumbar surgery. Will d/w Dr. Venetia MaxonStern ortho c/s vs o/p f/u. ok to d/c to home from post-surgical NS standpoint.    Georgiann Cockeroteat, Favor Kreh 03/13/2014, 8:27 AM

## 2014-08-10 NOTE — H&P (Signed)
TOTAL HIP ADMISSION H&P  Patient is admitted for right total hip arthroplasty, anterior approach.  Subjective:  Chief Complaint:     Right hip primary OA / pain  HPI: Robyn Smith, 59 y.o. female, has a history of pain and functional disability in the right hip(s) due to arthritis and patient has failed non-surgical conservative treatments for greater than 12 weeks to include NSAID's and/or analgesics, corticosteriod injections and activity modification.  Onset of symptoms was gradual starting 16+ years ago with gradually worsening course since that time.The patient noted no past surgery on the right hip(s).  Patient currently rates pain in the right hip at 10 out of 10 with activity. Patient has night pain, worsening of pain with activity and weight bearing, trendelenberg gait, pain that interfers with activities of daily living and pain with passive range of motion. Patient has evidence of periarticular osteophytes and joint space narrowing by imaging studies. This condition presents safety issues increasing the risk of falls.  There is no current active infection.  Risks, benefits and expectations were discussed with the patient.  Risks including but not limited to the risk of anesthesia, blood clots, nerve damage, blood vessel damage, failure of the prosthesis, infection and up to and including death.  Patient understand the risks, benefits and expectations and wishes to proceed with surgery.   PCP: Aniceto Boss, MD  D/C Plans:      Home with HHPT  Post-op Meds:       No Rx given   Tranexamic Acid:      To be given - IV  Decadron:      Is to be given  FYI:     ASA post-op  Oxycodone post-op    Patient Active Problem List   Diagnosis Date Noted  . Scoliosis of lumbar spine 03/10/2014   Past Medical History  Diagnosis Date  . GERD (gastroesophageal reflux disease)   . Peripheral vascular disease   . Pneumonia     as a child  . Anxiety   . Fibromyalgia   . Arthritis   . Cancer    . Anemia     prior to hysterectomy  . PONV (postoperative nausea and vomiting)     "some" not too much    Past Surgical History  Procedure Laterality Date  . Back surgery      cervical fusion  . Carpal tunnel release Right   . Tonsillectomy    . Nasal septum surgery    . Vaginal hysterectomy    . Tubal ligation    . Colonoscopy    . Anterior lateral lumbar fusion 4 levels Left 03/10/2014    Procedure: Left Lumbar Ome-Two,Lumbar Two-three,Lumbar Three-four,Lumbar four-Five Anterior lateral lumbar fusion with percutaneous pedicle screws and rods;  Surgeon: Maeola Harman, MD;  Location: [facility];  Service: Neurosurgery;  Laterality: Left;  left side approach  . Lumbar percutaneous pedicle screw 4 level Left 03/10/2014    Procedure: LUMBAR PERCUTANEOUS PEDICLE SCREW LUMBAR ONE-TWO,LUMBAR TWO-THREE,LUMBARTHREE-FOUR,LUMBAR FOUR-FIVE;  Surgeon: Maeola Harman, MD;  Location: [facility];  Service: Neurosurgery;  Laterality: Left;    No prescriptions prior to admission   Allergies  Allergen Reactions  . Penicillins Other (See Comments)    As a child, doesn't know what type of reaction    History  Substance Use Topics  . Smoking status: Never Smoker   . Smokeless tobacco: Never Used  . Alcohol Use: Yes     Comment: occasional    Family History  Problem Relation Age of Onset  . CAD Mother   . Diabetes type II Mother   . Arthritis Mother   . COPD Father   . CAD Brother   . Diabetes type II Brother      Review of Systems  Constitutional: Positive for malaise/fatigue.  HENT: Negative.   Eyes: Negative.   Respiratory: Positive for shortness of breath (on exertion).   Cardiovascular: Positive for leg swelling.  Gastrointestinal: Positive for heartburn.  Genitourinary: Negative.   Musculoskeletal: Positive for myalgias, back pain and joint pain.  Skin: Negative.   Neurological: Positive for weakness.  Endo/Heme/Allergies: Negative.   Psychiatric/Behavioral: The patient is  nervous/anxious.     Objective:  Physical Exam  Constitutional: She is oriented to person, place, and time. She appears well-developed.  HENT:  Head: Normocephalic.  Eyes: Pupils are equal, round, and reactive to light.  Neck: Neck supple. No JVD present. No tracheal deviation present. No thyromegaly present.  Cardiovascular: Normal rate, regular rhythm, normal heart sounds and intact distal pulses.   Respiratory: Effort normal and breath sounds normal. No stridor. No respiratory distress. She has no wheezes.  GI: Soft. There is no tenderness. There is no guarding.  Musculoskeletal:       Right hip: She exhibits decreased range of motion, decreased strength, tenderness and bony tenderness. She exhibits no swelling, no deformity and no laceration.  Lymphadenopathy:    She has no cervical adenopathy.  Neurological: She is alert and oriented to person, place, and time.  Skin: Skin is warm and dry.  Psychiatric: She has a normal mood and affect.      Labs:  Estimated body mass index is 28.48 kg/(m^2) as calculated from the following:   Height as of 03/10/14: 5' 8.5" (1.74 m).   Weight as of 03/04/14: 86.229 kg (190 lb 1.6 oz).   Imaging Review Plain radiographs demonstrate severe degenerative joint disease of the right hip(s). The bone quality appears to be good for age and reported activity level.  Assessment/Plan:  End stage arthritis, right hip(s)  The patient history, physical examination, clinical judgement of the provider and imaging studies are consistent with end stage degenerative joint disease of the right hip(s) and total hip arthroplasty is deemed medically necessary. The treatment options including medical management, injection therapy, arthroscopy and arthroplasty were discussed at length. The risks and benefits of total hip arthroplasty were presented and reviewed. The risks due to aseptic loosening, infection, stiffness, dislocation/subluxation,  thromboembolic  complications and other imponderables were discussed.  The patient acknowledged the explanation, agreed to proceed with the plan and consent was signed. Patient is being admitted for inpatient treatment for surgery, pain control, PT, OT, prophylactic antibiotics, VTE prophylaxis, progressive ambulation and ADL's and discharge planning.The patient is planning to be discharged home with home health services.     Anastasio Auerbach Oz Gammel   PA-C  08/10/2014, 12:41 PM

## 2014-08-11 ENCOUNTER — Encounter (HOSPITAL_COMMUNITY): Payer: Self-pay

## 2014-08-11 ENCOUNTER — Encounter (HOSPITAL_COMMUNITY)
Admission: RE | Admit: 2014-08-11 | Discharge: 2014-08-11 | Disposition: A | Discharge status: Home / Self Care | Payer: BLUE CROSS/BLUE SHIELD | Source: Ambulatory Visit | Attending: Orthopedic Surgery | Admitting: Orthopedic Surgery

## 2014-08-11 DIAGNOSIS — M1611 Unilateral primary osteoarthritis, right hip: Secondary | ICD-10-CM | POA: Diagnosis not present

## 2014-08-11 DIAGNOSIS — Z01812 Encounter for preprocedural laboratory examination: Secondary | ICD-10-CM | POA: Diagnosis present

## 2014-08-11 HISTORY — DX: Venous insufficiency (chronic) (peripheral): I87.2

## 2014-08-11 HISTORY — DX: Changes in skin texture: R23.4

## 2014-08-11 LAB — SURGICAL PCR SCREEN
MRSA, PCR: NEGATIVE
Staphylococcus aureus: NEGATIVE

## 2014-08-11 LAB — APTT: aPTT: 30 seconds (ref 24–37)

## 2014-08-11 LAB — PROTIME-INR
INR: 1.04 (ref 0.00–1.49)
PROTHROMBIN TIME: 13.8 s (ref 11.6–15.2)

## 2014-08-11 LAB — BASIC METABOLIC PANEL
ANION GAP: 5 (ref 5–15)
BUN: 14 mg/dL (ref 6–20)
CO2: 28 mmol/L (ref 22–32)
Calcium: 9.1 mg/dL (ref 8.9–10.3)
Chloride: 109 mmol/L (ref 101–111)
Creatinine, Ser: 0.73 mg/dL (ref 0.44–1.00)
GFR calc Af Amer: >60 mL/min (ref >60)
Glucose, Bld: 99 mg/dL (ref 65–99)
POTASSIUM: 4.4 mmol/L (ref 3.5–5.1)
SODIUM: 142 mmol/L (ref 135–145)

## 2014-08-11 LAB — URINALYSIS, ROUTINE W REFLEX MICROSCOPIC
Bilirubin Urine: NEGATIVE
Glucose, UA: NEGATIVE mg/dL
Hgb urine dipstick: NEGATIVE
Ketones, ur: NEGATIVE mg/dL
Leukocytes, UA: NEGATIVE
Nitrite: NEGATIVE
PROTEIN: NEGATIVE mg/dL
SPECIFIC GRAVITY, URINE: 1.02 (ref 1.005–1.030)
UROBILINOGEN UA: 1 mg/dL (ref 0.0–1.0)
pH: 7 (ref 5.0–8.0)

## 2014-08-11 LAB — CBC
HEMATOCRIT: 37.9 % (ref 36.0–46.0)
Hemoglobin: 12.2 g/dL (ref 12.0–15.0)
MCH: 30.6 pg (ref 26.0–34.0)
MCHC: 32.2 g/dL (ref 30.0–36.0)
MCV: 95 fL (ref 78.0–100.0)
Platelets: 313 10*3/uL (ref 150–400)
RBC: 3.99 MIL/uL (ref 3.87–5.11)
RDW: 13.5 % (ref 11.5–15.5)
WBC: 7.1 10*3/uL (ref 4.0–10.5)

## 2014-08-11 NOTE — Patient Instructions (Addendum)
Robyn Smith  08/11/2014   Your procedure is scheduled on: Tuesday 08/18/14  Report to [facility] Main  Entrance take [facility]  elevators to 3rd floor to  Short Stay Center at 07:00 AM.  Call this number if you have problems the morning of surgery [phone]   Remember: ONLY 1 PERSON MAY GO WITH YOU TO SHORT STAY TO GET  READY MORNING OF YOUR SURGERY.  Do not eat food or drink liquids :After Midnight.     Take these medicines the morning of surgery with A SIP OF WATER: xanax if needed,  percocet if needed, clonazepam                               You may not have any metal on your body including hair pins and              piercings  Do not wear jewelry, make-up, lotions, powders or perfumes, deodorant             Do not wear nail polish.  Do not shave  48 hours prior to surgery.              Men may shave face and neck.  Do not bring valuables to the hospital. [facility] IS NOT             RESPONSIBLE   FOR VALUABLES.  Contacts, dentures or bridgework may not be worn into surgery.  Leave suitcase in the car. After surgery it may be brought to your room.              Please read over the following fact sheets you were given: MRSA information  _____________________________________________________________________           [facility] - Preparing for Surgery Before surgery, you can play an important role.  Because skin is not sterile, your skin needs to be as free of germs as possible.  You can reduce the number of germs on your skin by washing with CHG (chlorahexidine gluconate) soap before surgery.  CHG is an antiseptic cleaner which kills germs and bonds with the skin to continue killing germs even after washing. Please DO NOT use if you have an allergy to CHG or antibacterial soaps.  If your skin becomes reddened/irritated stop using the CHG and inform your nurse when you arrive at Short Stay. Do not shave (including legs and underarms) for at least 48 hours prior  to the first CHG shower.  You may shave your face/neck. Please follow these instructions carefully:  1.  Shower with CHG Soap the night before surgery and the  morning of Surgery.  2.  If you choose to wash your hair, wash your hair first as usual with your  normal  shampoo.  3.  After you shampoo, rinse your hair and body thoroughly to remove the  shampoo.                            4.  Use CHG as you would any other liquid soap.  You can apply chg directly  to the skin and wash                       Gently with a scrungie or clean washcloth.  5.  Apply the  CHG Soap to your body ONLY FROM THE NECK DOWN.   Do not use on face/ open                           Wound or open sores. Avoid contact with eyes, ears mouth and genitals (private parts).                       Wash face,  Genitals (private parts) with your normal soap.             6.  Wash thoroughly, paying special attention to the area where your surgery  will be performed.  7.  Thoroughly rinse your body with warm water from the neck down.  8.  DO NOT shower/wash with your normal soap after using and rinsing off  the CHG Soap.                9.  Pat yourself dry with a clean towel.            10.  Wear clean pajamas.            11.  Place clean sheets on your bed the night of your first shower and do not  sleep with pets. Day of Surgery : Do not apply any lotions/deodorants the morning of surgery.  Please wear clean clothes to the hospital/surgery center.  FAILURE TO FOLLOW THESE INSTRUCTIONS MAY RESULT IN THE CANCELLATION OF YOUR SURGERY PATIENT SIGNATURE_________________________________  NURSE SIGNATURE__________________________________  ________________________________________________________________________  WHAT IS A BLOOD TRANSFUSION? Blood Transfusion Information  A transfusion is the replacement of blood or some of its parts. Blood is made up of multiple cells which provide different functions.  Red blood cells carry  oxygen and are used for blood loss replacement.  White blood cells fight against infection.  Platelets control bleeding.  Plasma helps clot blood.  Other blood products are available for specialized needs, such as hemophilia or other clotting disorders. BEFORE THE TRANSFUSION  Who gives blood for transfusions?   Healthy volunteers who are fully evaluated to make sure their blood is safe. This is blood bank blood. Transfusion therapy is the safest it has ever been in the practice of medicine. Before blood is taken from a donor, a complete history is taken to make sure that person has no history of diseases nor engages in risky social behavior (examples are intravenous drug use or sexual activity with multiple partners). The donor's travel history is screened to minimize risk of transmitting infections, such as malaria. The donated blood is tested for signs of infectious diseases, such as HIV and hepatitis. The blood is then tested to be sure it is compatible with you in order to minimize the chance of a transfusion reaction. If you or a relative donates blood, this is often done in anticipation of surgery and is not appropriate for emergency situations. It takes many days to process the donated blood. RISKS AND COMPLICATIONS Although transfusion therapy is very safe and saves many lives, the main dangers of transfusion include:  1. Getting an infectious disease. 2. Developing a transfusion reaction. This is an allergic reaction to something in the blood you were given. Every precaution is taken to prevent this. The decision to have a blood transfusion has been considered carefully by your caregiver before blood is given. Blood is not given unless the benefits outweigh the risks. AFTER THE TRANSFUSION  Right after receiving a blood transfusion,  you will usually feel much better and more energetic. This is especially true if your red blood cells have gotten low (anemic). The transfusion raises the  level of the red blood cells which carry oxygen, and this usually causes an energy increase.  The nurse administering the transfusion will monitor you carefully for complications. HOME CARE INSTRUCTIONS  No special instructions are needed after a transfusion. You may find your energy is better. Speak with your caregiver about any limitations on activity for underlying diseases you may have. SEEK MEDICAL CARE IF:   Your condition is not improving after your transfusion.  You develop redness or irritation at the intravenous (IV) site. SEEK IMMEDIATE MEDICAL CARE IF:  Any of the following symptoms occur over the next 12 hours:  Shaking chills.  You have a temperature by mouth above 102 F (38.9 C), not controlled by medicine.  Chest, back, or muscle pain.  People around you feel you are not acting correctly or are confused.  Shortness of breath or difficulty breathing.  Dizziness and fainting.  You get a rash or develop hives.  You have a decrease in urine output.  Your urine turns a dark color or changes to pink, red, or brown. Any of the following symptoms occur over the next 10 days:  You have a temperature by mouth above 102 F (38.9 C), not controlled by medicine.  Shortness of breath.  Weakness after normal activity.  The white part of the eye turns yellow (jaundice).  You have a decrease in the amount of urine or are urinating less often.  Your urine turns a dark color or changes to pink, red, or brown. Document Released: 12/24/1999 Document Revised: 03/20/2011 Document Reviewed: 08/12/2007 [employer] Patient Information 2014 [place].  _______________________________________________________________________  Incentive Spirometer  An incentive spirometer is a tool that can help keep your lungs clear and active. This tool measures how well you are filling your lungs with each breath. Taking long deep breaths may help reverse or decrease the chance of  developing breathing (pulmonary) problems (especially infection) following:  A long period of time when you are unable to move or be active. BEFORE THE PROCEDURE   If the spirometer includes an indicator to show your best effort, your nurse or respiratory therapist will set it to a desired goal.  If possible, sit up straight or lean slightly forward. Try not to slouch.  Hold the incentive spirometer in an upright position. INSTRUCTIONS FOR USE  3. Sit on the edge of your bed if possible, or sit up as far as you can in bed or on a chair. 4. Hold the incentive spirometer in an upright position. 5. Breathe out normally. 6. Place the mouthpiece in your mouth and seal your lips tightly around it. 7. Breathe in slowly and as deeply as possible, raising the piston or the ball toward the top of the column. 8. Hold your breath for 3-5 seconds or for as long as possible. Allow the piston or ball to fall to the bottom of the column. 9. Remove the mouthpiece from your mouth and breathe out normally. 10. Rest for a few seconds and repeat Steps 1 through 7 at least 10 times every 1-2 hours when you are awake. Take your time and take a few normal breaths between deep breaths. 11. The spirometer may include an indicator to show your best effort. Use the indicator as a goal to work toward during each repetition. 12. After each set of 10 deep breaths,  practice coughing to be sure your lungs are clear. If you have an incision (the cut made at the time of surgery), support your incision when coughing by placing a pillow or rolled up towels firmly against it. Once you are able to get out of bed, walk around indoors and cough well. You may stop using the incentive spirometer when instructed by your caregiver.  RISKS AND COMPLICATIONS  Take your time so you do not get dizzy or light-headed.  If you are in pain, you may need to take or ask for pain medication before doing incentive spirometry. It is harder to take  a deep breath if you are having pain. AFTER USE  Rest and breathe slowly and easily.  It can be helpful to keep track of a log of your progress. Your caregiver can provide you with a simple table to help with this. If you are using the spirometer at home, follow these instructions: SEEK MEDICAL CARE IF:   You are having difficultly using the spirometer.  You have trouble using the spirometer as often as instructed.  Your pain medication is not giving enough relief while using the spirometer.  You develop fever of 100.5 F (38.1 C) or higher. SEEK IMMEDIATE MEDICAL CARE IF:   You cough up bloody sputum that had not been present before.  You develop fever of 102 F (38.9 C) or greater.  You develop worsening pain at or near the incision site. MAKE SURE YOU:   Understand these instructions.  Will watch your condition.  Will get help right away if you are not doing well or get worse. Document Released: 05/08/2006 Document Revised: 03/20/2011 Document Reviewed: 07/09/2006 [facility] Patient Information 2014 [place], [place].   ________________________________________________________________________

## 2014-08-12 LAB — ABO/RH: ABO/RH(D): O POS

## 2014-08-17 NOTE — Progress Notes (Addendum)
EKG 07/14/14 on chart. Dr. Daryel November is pt's PCP. Dr. Rondel Baton office left message that he is working on getting a clearance with [facility].

## 2014-08-18 ENCOUNTER — Inpatient Hospital Stay (HOSPITAL_COMMUNITY): Payer: BLUE CROSS/BLUE SHIELD | Admitting: Registered Nurse

## 2014-08-18 ENCOUNTER — Inpatient Hospital Stay (HOSPITAL_COMMUNITY)
Admission: RE | Admit: 2014-08-18 | Discharge: 2014-08-19 | DRG: 470 | Disposition: A | Discharge status: Home Health | Payer: BLUE CROSS/BLUE SHIELD | Source: Ambulatory Visit | Attending: Orthopedic Surgery | Admitting: Orthopedic Surgery

## 2014-08-18 ENCOUNTER — Encounter (HOSPITAL_COMMUNITY): Payer: Self-pay | Admitting: *Deleted

## 2014-08-18 ENCOUNTER — Inpatient Hospital Stay (HOSPITAL_COMMUNITY): Payer: BLUE CROSS/BLUE SHIELD

## 2014-08-18 ENCOUNTER — Encounter (HOSPITAL_COMMUNITY)
Admission: RE | Disposition: A | Discharge status: Home Health | Payer: Self-pay | Source: Ambulatory Visit | Attending: Orthopedic Surgery

## 2014-08-18 DIAGNOSIS — D649 Anemia, unspecified: Secondary | ICD-10-CM | POA: Diagnosis present

## 2014-08-18 DIAGNOSIS — Z01812 Encounter for preprocedural laboratory examination: Secondary | ICD-10-CM

## 2014-08-18 DIAGNOSIS — F419 Anxiety disorder, unspecified: Secondary | ICD-10-CM | POA: Diagnosis present

## 2014-08-18 DIAGNOSIS — Z8249 Family history of ischemic heart disease and other diseases of the circulatory system: Secondary | ICD-10-CM | POA: Diagnosis not present

## 2014-08-18 DIAGNOSIS — Z6828 Body mass index (BMI) 28.0-28.9, adult: Secondary | ICD-10-CM | POA: Diagnosis not present

## 2014-08-18 DIAGNOSIS — Z96649 Presence of unspecified artificial hip joint: Secondary | ICD-10-CM

## 2014-08-18 DIAGNOSIS — K219 Gastro-esophageal reflux disease without esophagitis: Secondary | ICD-10-CM | POA: Diagnosis present

## 2014-08-18 DIAGNOSIS — M419 Scoliosis, unspecified: Secondary | ICD-10-CM | POA: Diagnosis present

## 2014-08-18 DIAGNOSIS — M25551 Pain in right hip: Secondary | ICD-10-CM | POA: Diagnosis present

## 2014-08-18 DIAGNOSIS — M1611 Unilateral primary osteoarthritis, right hip: Principal | ICD-10-CM | POA: Diagnosis present

## 2014-08-18 DIAGNOSIS — M797 Fibromyalgia: Secondary | ICD-10-CM | POA: Diagnosis present

## 2014-08-18 DIAGNOSIS — Z981 Arthrodesis status: Secondary | ICD-10-CM | POA: Diagnosis not present

## 2014-08-18 DIAGNOSIS — I739 Peripheral vascular disease, unspecified: Secondary | ICD-10-CM | POA: Diagnosis present

## 2014-08-18 DIAGNOSIS — E663 Overweight: Secondary | ICD-10-CM | POA: Diagnosis present

## 2014-08-18 HISTORY — PX: TOTAL HIP ARTHROPLASTY: SHX124

## 2014-08-18 LAB — TYPE AND SCREEN
ABO/RH(D): O POS
Antibody Screen: NEGATIVE

## 2014-08-18 SURGERY — ARTHROPLASTY, HIP, TOTAL, ANTERIOR APPROACH
Anesthesia: General | Site: Hip | Laterality: Right

## 2014-08-18 MED ORDER — MIDAZOLAM HCL 5 MG/5ML IJ SOLN
INTRAMUSCULAR | Status: DC | PRN
  Administered 2014-08-18: 2 mg via INTRAVENOUS

## 2014-08-18 MED ORDER — NEOSTIGMINE METHYLSULFATE 10 MG/10ML IV SOLN
INTRAVENOUS | Status: DC | PRN
  Administered 2014-08-18: 2 mg via INTRAVENOUS

## 2014-08-18 MED ORDER — CEFAZOLIN SODIUM-DEXTROSE 2-3 GM-% IV SOLR
2.0000 g | INTRAVENOUS | Status: AC
  Administered 2014-08-18: 2 g via INTRAVENOUS

## 2014-08-18 MED ORDER — FENTANYL CITRATE (PF) 100 MCG/2ML IJ SOLN
INTRAMUSCULAR | Status: DC | PRN
  Administered 2014-08-18 (×2): 50 ug via INTRAVENOUS

## 2014-08-18 MED ORDER — DEXAMETHASONE SODIUM PHOSPHATE 10 MG/ML IJ SOLN
10.0000 mg | Freq: Once | INTRAMUSCULAR | Status: AC
  Administered 2014-08-18: 10 mg via INTRAVENOUS

## 2014-08-18 MED ORDER — HYDROMORPHONE HCL 2 MG/ML IJ SOLN
INTRAMUSCULAR | Status: AC
  Filled 2014-08-18: qty 1

## 2014-08-18 MED ORDER — HYDROMORPHONE HCL 1 MG/ML IJ SOLN
0.2500 mg | INTRAMUSCULAR | Status: DC | PRN
  Administered 2014-08-18 (×4): 0.5 mg via INTRAVENOUS

## 2014-08-18 MED ORDER — ONDANSETRON HCL 4 MG/2ML IJ SOLN
INTRAMUSCULAR | Status: AC
  Filled 2014-08-18: qty 2

## 2014-08-18 MED ORDER — DEXAMETHASONE SODIUM PHOSPHATE 10 MG/ML IJ SOLN
INTRAMUSCULAR | Status: AC
  Filled 2014-08-18: qty 1

## 2014-08-18 MED ORDER — HYDROMORPHONE HCL 1 MG/ML IJ SOLN
0.5000 mg | INTRAMUSCULAR | Status: DC | PRN
  Administered 2014-08-18 – 2014-08-19 (×3): 1 mg via INTRAVENOUS
  Filled 2014-08-18 (×3): qty 1

## 2014-08-18 MED ORDER — ONDANSETRON HCL 4 MG PO TABS: 4.0000 mg | ORAL_TABLET | Freq: Four times a day (QID) | ORAL | Status: DC | PRN

## 2014-08-18 MED ORDER — HYDROMORPHONE HCL 1 MG/ML IJ SOLN
INTRAMUSCULAR | Status: AC
  Filled 2014-08-18: qty 1

## 2014-08-18 MED ORDER — PROPOFOL 10 MG/ML IV BOLUS
INTRAVENOUS | Status: AC
  Filled 2014-08-18: qty 20

## 2014-08-18 MED ORDER — METOCLOPRAMIDE HCL 5 MG PO TABS
5.0000 mg | ORAL_TABLET | Freq: Three times a day (TID) | ORAL | Status: DC | PRN
Start: 2014-08-18 — End: 2014-08-19
  Filled 2014-08-18: qty 2

## 2014-08-18 MED ORDER — METOCLOPRAMIDE HCL 5 MG/ML IJ SOLN
5.0000 mg | Freq: Three times a day (TID) | INTRAMUSCULAR | Status: DC | PRN
  Administered 2014-08-18 – 2014-08-19 (×3): 10 mg via INTRAVENOUS
  Filled 2014-08-18 (×3): qty 2

## 2014-08-18 MED ORDER — PROMETHAZINE HCL 25 MG/ML IJ SOLN
6.2500 mg | INTRAMUSCULAR | Status: DC | PRN
Start: 2014-08-18 — End: 2014-08-18

## 2014-08-18 MED ORDER — SUCCINYLCHOLINE CHLORIDE 20 MG/ML IJ SOLN
INTRAMUSCULAR | Status: DC | PRN
  Administered 2014-08-18: 100 mg via INTRAVENOUS

## 2014-08-18 MED ORDER — METHOCARBAMOL 1000 MG/10ML IJ SOLN
500.0000 mg | Freq: Four times a day (QID) | INTRAVENOUS | Status: DC | PRN
  Administered 2014-08-18 – 2014-08-19 (×2): 500 mg via INTRAVENOUS
  Filled 2014-08-18 (×4): qty 5

## 2014-08-18 MED ORDER — SUFENTANIL CITRATE 50 MCG/ML IV SOLN
INTRAVENOUS | Status: DC | PRN
Start: 2014-08-18 — End: 2014-08-18
  Administered 2014-08-18 (×5): 10 ug via INTRAVENOUS

## 2014-08-18 MED ORDER — DOCUSATE SODIUM 100 MG PO CAPS
100.0000 mg | ORAL_CAPSULE | Freq: Two times a day (BID) | ORAL | Status: DC
  Administered 2014-08-19: 100 mg via ORAL

## 2014-08-18 MED ORDER — SODIUM CHLORIDE 0.9 % IJ SOLN
INTRAMUSCULAR | Status: AC
  Filled 2014-08-18: qty 10

## 2014-08-18 MED ORDER — OXYCODONE HCL 5 MG PO TABS
5.0000 mg | ORAL_TABLET | ORAL | Status: DC
  Administered 2014-08-18: 10 mg via ORAL
  Administered 2014-08-18: 15 mg via ORAL
  Administered 2014-08-19 (×2): 10 mg via ORAL
  Filled 2014-08-18 (×2): qty 3
  Filled 2014-08-18 (×3): qty 2

## 2014-08-18 MED ORDER — METHOCARBAMOL 500 MG PO TABS: 500.0000 mg | ORAL_TABLET | Freq: Four times a day (QID) | ORAL | Status: DC | PRN

## 2014-08-18 MED ORDER — CELECOXIB 200 MG PO CAPS
200.0000 mg | ORAL_CAPSULE | Freq: Two times a day (BID) | ORAL | Status: DC
  Administered 2014-08-19: 200 mg via ORAL
  Filled 2014-08-18 (×3): qty 1

## 2014-08-18 MED ORDER — ROCURONIUM BROMIDE 100 MG/10ML IV SOLN
INTRAVENOUS | Status: DC | PRN
  Administered 2014-08-18: 45 mg via INTRAVENOUS
  Administered 2014-08-18: 5 mg via INTRAVENOUS

## 2014-08-18 MED ORDER — ASPIRIN EC 325 MG PO TBEC: 325.0000 mg | DELAYED_RELEASE_TABLET | Freq: Two times a day (BID) | ORAL | Status: AC

## 2014-08-18 MED ORDER — DEXAMETHASONE SODIUM PHOSPHATE 10 MG/ML IJ SOLN
10.0000 mg | Freq: Once | INTRAMUSCULAR | Status: AC
  Administered 2014-08-19: 10 mg via INTRAVENOUS
  Filled 2014-08-18: qty 1

## 2014-08-18 MED ORDER — POLYETHYLENE GLYCOL 3350 17 G PO PACK
17.0000 g | PACK | Freq: Two times a day (BID) | ORAL | Status: DC
  Administered 2014-08-19: 17 g via ORAL

## 2014-08-18 MED ORDER — MIDAZOLAM HCL 2 MG/2ML IJ SOLN
INTRAMUSCULAR | Status: AC
  Filled 2014-08-18: qty 4

## 2014-08-18 MED ORDER — TRANEXAMIC ACID 1000 MG/10ML IV SOLN
1000.0000 mg | Freq: Once | INTRAVENOUS | Status: AC
  Administered 2014-08-18: 1000 mg via INTRAVENOUS
  Filled 2014-08-18: qty 10

## 2014-08-18 MED ORDER — LACTATED RINGERS IV SOLN
INTRAVENOUS | Status: DC | PRN
  Administered 2014-08-18 (×2): via INTRAVENOUS

## 2014-08-18 MED ORDER — SODIUM CHLORIDE 0.9 % IV SOLN
100.0000 mL/h | INTRAVENOUS | Status: DC
  Filled 2014-08-18 (×4): qty 1000

## 2014-08-18 MED ORDER — ROCURONIUM BROMIDE 100 MG/10ML IV SOLN
INTRAVENOUS | Status: AC
  Filled 2014-08-18: qty 1

## 2014-08-18 MED ORDER — MAGNESIUM CITRATE PO SOLN: 1.0000 | Freq: Once | ORAL | Status: DC | PRN

## 2014-08-18 MED ORDER — GLYCOPYRROLATE 0.2 MG/ML IJ SOLN
INTRAMUSCULAR | Status: DC | PRN
  Administered 2014-08-18: 0.4 mg via INTRAVENOUS

## 2014-08-18 MED ORDER — MENTHOL 3 MG MT LOZG: 1.0000 | LOZENGE | OROMUCOSAL | Status: DC | PRN

## 2014-08-18 MED ORDER — OXYCODONE HCL 5 MG PO TABS: 5.0000 mg | ORAL_TABLET | ORAL | Status: DC | PRN

## 2014-08-18 MED ORDER — CEFAZOLIN SODIUM-DEXTROSE 2-3 GM-% IV SOLR
2.0000 g | Freq: Four times a day (QID) | INTRAVENOUS | Status: AC
  Administered 2014-08-18 (×2): 2 g via INTRAVENOUS
  Filled 2014-08-18 (×3): qty 50

## 2014-08-18 MED ORDER — FENTANYL CITRATE (PF) 100 MCG/2ML IJ SOLN
INTRAMUSCULAR | Status: AC
  Filled 2014-08-18: qty 4

## 2014-08-18 MED ORDER — LIDOCAINE HCL (CARDIAC) 20 MG/ML IV SOLN
INTRAVENOUS | Status: AC
  Filled 2014-08-18: qty 5

## 2014-08-18 MED ORDER — METOCLOPRAMIDE HCL 5 MG/ML IJ SOLN
INTRAMUSCULAR | Status: DC | PRN
  Administered 2014-08-18: 10 mg via INTRAVENOUS

## 2014-08-18 MED ORDER — DIPHENHYDRAMINE HCL 25 MG PO CAPS: 25.0000 mg | ORAL_CAPSULE | Freq: Four times a day (QID) | ORAL | Status: DC | PRN

## 2014-08-18 MED ORDER — ONDANSETRON HCL 4 MG/2ML IJ SOLN
INTRAMUSCULAR | Status: DC | PRN
  Administered 2014-08-18: 4 mg via INTRAVENOUS

## 2014-08-18 MED ORDER — ASPIRIN EC 325 MG PO TBEC
325.0000 mg | DELAYED_RELEASE_TABLET | Freq: Two times a day (BID) | ORAL | Status: DC
  Administered 2014-08-19: 325 mg via ORAL
  Filled 2014-08-18 (×3): qty 1

## 2014-08-18 MED ORDER — SUFENTANIL CITRATE 50 MCG/ML IV SOLN
INTRAVENOUS | Status: AC
  Filled 2014-08-18: qty 1

## 2014-08-18 MED ORDER — CLONAZEPAM 0.5 MG PO TABS
0.5000 mg | ORAL_TABLET | Freq: Two times a day (BID) | ORAL | Status: DC
  Administered 2014-08-18 – 2014-08-19 (×2): 0.5 mg via ORAL
  Filled 2014-08-18 (×2): qty 1

## 2014-08-18 MED ORDER — FERROUS SULFATE 325 (65 FE) MG PO TABS
325.0000 mg | ORAL_TABLET | Freq: Three times a day (TID) | ORAL | Status: DC
  Administered 2014-08-19: 325 mg via ORAL
  Filled 2014-08-18 (×5): qty 1

## 2014-08-18 MED ORDER — ONDANSETRON HCL 4 MG/2ML IJ SOLN
4.0000 mg | Freq: Four times a day (QID) | INTRAMUSCULAR | Status: DC | PRN
  Administered 2014-08-18 – 2014-08-19 (×2): 4 mg via INTRAVENOUS
  Filled 2014-08-18 (×2): qty 2

## 2014-08-18 MED ORDER — PHENOL 1.4 % MT LIQD
1.0000 | OROMUCOSAL | Status: DC | PRN
  Filled 2014-08-18: qty 177

## 2014-08-18 MED ORDER — ALUM & MAG HYDROXIDE-SIMETH 200-200-20 MG/5ML PO SUSP: 30.0000 mL | ORAL | Status: DC | PRN

## 2014-08-18 MED ORDER — PROPOFOL 10 MG/ML IV BOLUS
INTRAVENOUS | Status: DC | PRN
  Administered 2014-08-18: 200 mg via INTRAVENOUS

## 2014-08-18 MED ORDER — 0.9 % SODIUM CHLORIDE (POUR BTL) OPTIME
TOPICAL | Status: DC | PRN
  Administered 2014-08-18: 1000 mL

## 2014-08-18 MED ORDER — ALPRAZOLAM 0.25 MG PO TABS
0.2500 mg | ORAL_TABLET | Freq: Two times a day (BID) | ORAL | Status: DC | PRN
  Administered 2014-08-18: 0.25 mg via ORAL
  Filled 2014-08-18: qty 1

## 2014-08-18 MED ORDER — LIDOCAINE HCL (CARDIAC) 20 MG/ML IV SOLN
INTRAVENOUS | Status: DC | PRN
  Administered 2014-08-18: 25 mg via INTRATRACHEAL
  Administered 2014-08-18: 75 mg via INTRAVENOUS

## 2014-08-18 MED ORDER — BISACODYL 10 MG RE SUPP: 10.0000 mg | Freq: Every day | RECTAL | Status: DC | PRN

## 2014-08-18 MED ORDER — BUPIVACAINE IN DEXTROSE 0.75-8.25 % IT SOLN: INTRATHECAL | Status: DC | PRN

## 2014-08-18 MED ORDER — METHOCARBAMOL 500 MG PO TABS
500.0000 mg | ORAL_TABLET | Freq: Four times a day (QID) | ORAL | Status: DC | PRN
  Filled 2014-08-18: qty 1

## 2014-08-18 MED ORDER — HYDROMORPHONE HCL 1 MG/ML IJ SOLN
INTRAMUSCULAR | Status: DC | PRN
  Administered 2014-08-18 (×2): 1 mg via INTRAVENOUS

## 2014-08-18 MED ORDER — LORATADINE 10 MG PO TABS
10.0000 mg | ORAL_TABLET | Freq: Every day | ORAL | Status: DC
  Filled 2014-08-18 (×2): qty 1

## 2014-08-18 MED ORDER — CEFAZOLIN SODIUM-DEXTROSE 2-3 GM-% IV SOLR
INTRAVENOUS | Status: AC
  Filled 2014-08-18: qty 50

## 2014-08-18 MED ORDER — PHENYLEPHRINE HCL 10 MG/ML IJ SOLN
INTRAMUSCULAR | Status: DC | PRN
  Administered 2014-08-18 (×2): 120 ug via INTRAVENOUS

## 2014-08-18 SURGICAL SUPPLY — 40 items
BAG ZIPLOCK 12X15 (MISCELLANEOUS) IMPLANT
CAPT HIP TOTAL 2 ×2 IMPLANT
COVER PERINEAL POST (MISCELLANEOUS) ×2 IMPLANT
DRAPE C-ARM 42X120 X-RAY (DRAPES) ×2 IMPLANT
DRAPE STERI IOBAN 125X83 (DRAPES) ×2 IMPLANT
DRAPE U-SHAPE 47X51 STRL (DRAPES) ×6 IMPLANT
DRSG AQUACEL AG ADV 3.5X10 (GAUZE/BANDAGES/DRESSINGS) ×2 IMPLANT
DURAPREP 26ML APPLICATOR (WOUND CARE) ×2 IMPLANT
ELECT BLADE TIP CTD 4 INCH (ELECTRODE) ×2 IMPLANT
ELECT PENCIL ROCKER SW 15FT (MISCELLANEOUS) ×2 IMPLANT
ELECT REM PT RETURN 15FT ADLT (MISCELLANEOUS) ×2 IMPLANT
ELECT REM PT RETURN 9FT ADLT (ELECTROSURGICAL) ×2
ELECTRODE REM PT RTRN 9FT ADLT (ELECTROSURGICAL) ×1 IMPLANT
FACESHIELD WRAPAROUND (MASK) ×8 IMPLANT
GLOVE BIOGEL PI IND STRL 7.5 (GLOVE) ×3 IMPLANT
GLOVE BIOGEL PI IND STRL 8.5 (GLOVE) ×1 IMPLANT
GLOVE BIOGEL PI INDICATOR 7.5 (GLOVE) ×3
GLOVE BIOGEL PI INDICATOR 8.5 (GLOVE) ×1
GLOVE ECLIPSE 8.0 STRL XLNG CF (GLOVE) ×4 IMPLANT
GLOVE ORTHO TXT STRL SZ7.5 (GLOVE) ×2 IMPLANT
GLOVE SURG SS PI 7.0 STRL IVOR (GLOVE) ×2 IMPLANT
GLOVE SURG SS PI 7.5 STRL IVOR (GLOVE) ×2 IMPLANT
GOWN SPEC L3 XXLG W/TWL (GOWN DISPOSABLE) ×4 IMPLANT
GOWN STRL REUS W/TWL LRG LVL3 (GOWN DISPOSABLE) ×4 IMPLANT
HOLDER FOLEY CATH W/STRAP (MISCELLANEOUS) ×2 IMPLANT
KIT BASIN OR (CUSTOM PROCEDURE TRAY) ×2 IMPLANT
LIQUID BAND (GAUZE/BANDAGES/DRESSINGS) ×2 IMPLANT
PACK TOTAL JOINT (CUSTOM PROCEDURE TRAY) ×2 IMPLANT
PEN SKIN MARKING BROAD (MISCELLANEOUS) ×2 IMPLANT
SAW OSC TIP CART 19.5X105X1.3 (SAW) ×2 IMPLANT
SUT MNCRL AB 4-0 PS2 18 (SUTURE) ×2 IMPLANT
SUT VIC AB 1 CT1 36 (SUTURE) ×6 IMPLANT
SUT VIC AB 2-0 CT1 27 (SUTURE) ×2
SUT VIC AB 2-0 CT1 TAPERPNT 27 (SUTURE) ×2 IMPLANT
SUT VLOC 180 0 24IN GS25 (SUTURE) ×2 IMPLANT
TOWEL OR 17X26 10 PK STRL BLUE (TOWEL DISPOSABLE) ×2 IMPLANT
TOWEL OR NON WOVEN STRL DISP B (DISPOSABLE) ×2 IMPLANT
TRAY FOLEY W/METER SILVER 14FR (SET/KITS/TRAYS/PACK) ×2 IMPLANT
WATER STERILE IRR 1500ML POUR (IV SOLUTION) ×2 IMPLANT
YANKAUER SUCT BULB TIP 10FT TU (MISCELLANEOUS) ×2 IMPLANT

## 2014-08-18 NOTE — Evaluation (Signed)
Physical Therapy Evaluation Patient Details Name: Robyn Smith MRN: [id number] DOB: 05/20/1955 Today's Date: 08/18/2014   History of Present Illness  Pt is a 59 year old female s/p R THA direct anterior approach with PMHx of PVD, fibromyalgia, lumbar fusion  Clinical Impression  Pt is s/p R THA resulting in the deficits listed below (see PT Problem List).  Pt will benefit from skilled PT to increase their independence and safety with mobility to allow discharge to the venue listed below.  Pt tolerated short distance ambulation well POD #0 and plans to return home with spouse upon d/c.     Follow Up Recommendations Home health PT    Equipment Recommendations  None recommended by PT    Recommendations for Other Services       Precautions / Restrictions Precautions Precautions: Fall Precaution Comments: pt reports maintaining back precautions from previous lumbar fusion Restrictions Weight Bearing Restrictions: Yes RLE Weight Bearing: Weight bearing as tolerated      Mobility  Bed Mobility Overal bed mobility: Needs Assistance Bed Mobility: Rolling;Sidelying to Sit;Sit to Sidelying   Sidelying to sit: Supervision Supine to sit: Supervision   Sit to sidelying: Mod assist General bed mobility comments: pt performing log roll technique due to lumbar fusion hx, educated to place pillow between knees  Transfers Overall transfer level: Needs assistance Equipment used: Rolling walker (2 wheeled) Transfers: Sit to/from Stand Sit to Stand: Min assist         General transfer comment: verbal cues for safe technique  Ambulation/Gait Ambulation/Gait assistance: Min guard Ambulation Distance (Feet): 40 Feet Assistive device: Rolling walker (2 wheeled) Gait Pattern/deviations: Step-to pattern;Antalgic;Trunk flexed     General Gait Details: verbal cues for sequence, step length, posture, RW distance  Stairs            Wheelchair Mobility    Modified Rankin (Stroke  Patients Only)       Balance                                             Pertinent Vitals/Pain Pain Assessment: 0-10 Pain Score: 3  Pain Location: R hip Pain Descriptors / Indicators: Aching;Sore Pain Intervention(s): Limited activity within patient's tolerance;Monitored during session;Repositioned;Premedicated before session    Home Living Family/patient expects to be discharged to:: Private residence Living Arrangements: Spouse/significant other Available Help at Discharge: Family;Available 24 hours/day Type of Home: House Home Access: Stairs to enter   [employer] of Steps: 2-3 Home Layout: Laundry or work area in basement;Two level;Able to live on main level with bedroom/bathroom Home Equipment: Dan Humphreys - 2 wheels;Bedside commode      Prior Function Level of Independence: Independent               Hand Dominance        Extremity/Trunk Assessment               Lower Extremity Assessment: LLE deficits/detail   LLE Deficits / Details: functional weakness observed, required assist for bed mobility     Communication   Communication: No difficulties  Cognition Arousal/Alertness: Awake/alert Behavior During Therapy: WFL for tasks assessed/performed Overall Cognitive Status: Within Functional Limits for tasks assessed                      General Comments      Exercises  Assessment/Plan    PT Assessment Patient needs continued PT services  PT Diagnosis Difficulty walking;Acute pain   PT Problem List Decreased strength;Decreased range of motion;Decreased mobility;Decreased knowledge of use of DME;Pain  PT Treatment Interventions DME instruction;Gait training;Functional mobility training;Patient/family education;Therapeutic activities;Therapeutic exercise;Stair training   PT Goals (Current goals can be found in the Care Plan section) Acute Rehab PT Goals PT Goal Formulation: With patient Time For Goal  Achievement: 08/22/14 Potential to Achieve Goals: Good    Frequency 7X/week   Barriers to discharge        Co-evaluation               End of Session Equipment Utilized During Treatment: Gait belt Activity Tolerance: Patient tolerated treatment well Patient left: in bed;with call bell/phone within reach           Time: 1610-9604 PT Time Calculation (min) (ACUTE ONLY): 15 min   Charges:   PT Evaluation $Initial PT Evaluation Tier I: 1 Procedure     PT G Codes:        Lyrik Buresh,KATHrine E 08/18/2014, 4:55 PM [name], PT, DPT 08/18/2014 Pager: [phone]

## 2014-08-18 NOTE — Interval H&P Note (Signed)
History and Physical Interval Note:  08/18/2014 8:46 AM  Robyn Smith  has presented today for surgery, with the diagnosis of right hip osteoarthritis  The various methods of treatment have been discussed with the patient and family. After consideration of risks, benefits and other options for treatment, the patient has consented to  Procedure(s): RIGHT TOTAL HIP ARTHROPLASTY ANTERIOR APPROACH (Right) as a surgical intervention .  The patient's history has been reviewed, patient examined, no change in status, stable for surgery.  I have reviewed the patient's chart and labs.  Questions were answered to the patient's satisfaction.     Shelda Pal

## 2014-08-18 NOTE — Discharge Instructions (Signed)

## 2014-08-18 NOTE — Anesthesia Postprocedure Evaluation (Signed)
  Anesthesia Post-op Note  Patient: Robyn Smith  Procedure(s) Performed: Procedure(s) (LRB): RIGHT TOTAL HIP ARTHROPLASTY ANTERIOR APPROACH (Right)  Patient Location: PACU  Anesthesia Type: General  Level of Consciousness: awake and alert   Airway and Oxygen Therapy: Patient Spontanous Breathing  Post-op Pain: mild  Post-op Assessment: Post-op Vital signs reviewed, Patient's Cardiovascular Status Stable, Respiratory Function Stable, Patent Airway and No signs of Nausea or vomiting  Last Vitals:  Filed Vitals:   08/18/14 1420  BP: 115/62  Pulse: 70  Temp: 36.7 C  Resp: 14    Post-op Vital Signs: stable   Complications: No apparent anesthesia complications

## 2014-08-18 NOTE — Anesthesia Procedure Notes (Signed)
Procedure Name: Intubation Date/Time: 08/18/2014 10:05 AM Performed by: Illene Silver Pre-anesthesia Checklist: Patient identified, Emergency Drugs available, Suction available and Patient being monitored Patient Re-evaluated:Patient Re-evaluated prior to inductionOxygen Delivery Method: Circle System Utilized Preoxygenation: Pre-oxygenation with 100% oxygen Intubation Type: IV induction Ventilation: Mask ventilation without difficulty Laryngoscope Size: Mac and 3 Grade View: Grade II Tube type: Oral Tube size: 7.5 mm Number of attempts: 1 Airway Equipment and Method: Stylet and Oral airway Placement Confirmation: ETT inserted through vocal cords under direct vision,  positive ETCO2 and breath sounds checked- equal and bilateral Tube secured with: Tape Dental Injury: Teeth and Oropharynx as per pre-operative assessment

## 2014-08-18 NOTE — Anesthesia Preprocedure Evaluation (Addendum)
Anesthesia Evaluation  Patient identified by MRN, date of birth, ID band Patient awake    Reviewed: Allergy & Precautions, NPO status , Patient's Chart, lab work & pertinent test results  History of Anesthesia Complications (+) PONV and history of anesthetic complications  Airway Mallampati: II  TM Distance: >3 FB Neck ROM: Full    Dental no notable dental hx.    Pulmonary pneumonia -, resolved,  breath sounds clear to auscultation  Pulmonary exam normal       Cardiovascular + Peripheral Vascular Disease negative cardio ROS Normal cardiovascular examRhythm:Regular Rate:Normal     Neuro/Psych Anxiety  Neuromuscular disease    GI/Hepatic Neg liver ROS, GERD-  ,  Endo/Other  negative endocrine ROS  Renal/GU negative Renal ROS  negative genitourinary   Musculoskeletal  (+) Arthritis -, Fibromyalgia -  Abdominal   Peds negative pediatric ROS (+)  Hematology  (+) anemia ,   Anesthesia Other Findings   Reproductive/Obstetrics negative OB ROS                            Anesthesia Physical Anesthesia Plan  ASA: II  Anesthesia Plan: General   Post-op Pain Management:    Induction: Intravenous  Airway Management Planned: Oral ETT  Additional Equipment:   Intra-op Plan:   Post-operative Plan: Extubation in OR  Informed Consent: I have reviewed the patients History and Physical, chart, labs and discussed the procedure including the risks, benefits and alternatives for the proposed anesthesia with the patient or authorized representative who has indicated his/her understanding and acceptance.   Dental advisory given  Plan Discussed with: CRNA  Anesthesia Plan Comments: (S/P recent extensive lumbar fusion. Plan general.)        Anesthesia Quick Evaluation

## 2014-08-18 NOTE — Transfer of Care (Signed)
Immediate Anesthesia Transfer of Care Note  Patient: Robyn Smith  Procedure(s) Performed: Procedure(s): RIGHT TOTAL HIP ARTHROPLASTY ANTERIOR APPROACH (Right)  Patient Location: PACU  Anesthesia Type:General  Level of Consciousness: awake, alert , oriented and patient cooperative  Airway & Oxygen Therapy: Patient Spontanous Breathing and Patient connected to face mask oxygen  Post-op Assessment: Report given to RN, Post -op Vital signs reviewed and stable and Patient moving all extremities X 4  Post vital signs: stable  Last Vitals: There were no vitals filed for this visit.  Complications: No apparent anesthesia complications

## 2014-08-18 NOTE — Progress Notes (Signed)
Utilization review completed.  

## 2014-08-18 NOTE — Op Note (Signed)
NAME:  Robyn Smith                ACCOUNT NO.: [id number]      MEDICAL RECORD NO.: [id number]      FACILITY:  [facility]      PHYSICIAN:  Durene Romans D  DATE OF BIRTH:  1955-02-16     DATE OF PROCEDURE:  08/18/2014                                 OPERATIVE REPORT         PREOPERATIVE DIAGNOSIS: Right  hip osteoarthritis.      POSTOPERATIVE DIAGNOSIS:  Right hip osteoarthritis.      PROCEDURE:  Right total hip replacement through an anterior approach   utilizing DePuy THR system, component size 52mm pinnacle cup, a size 36+4 neutral   Altrex liner, a size 6 Hi Tri Lock stem with a 36+1.5 delta ceramic   ball.      SURGEON:  Madlyn Frankel. Charlann Boxer, M.D.      ASSISTANT:  Lanney Gins, PA-C      ANESTHESIA:  General.      SPECIMENS:  None.      COMPLICATIONS:  None.      BLOOD LOSS:  600 cc     DRAINS:  None.      INDICATION OF THE PROCEDURE:  Robyn Smith is a 59 y.o. female who had   presented to office for evaluation of right hip pain.  Radiographs revealed   progressive degenerative changes with bone-on-bone   articulation to the  hip joint.  The patient had painful limited range of   motion significantly affecting their overall quality of life.  The patient was failing to    respond to conservative measures, and at this point was ready   to proceed with more definitive measures.  The patient has noted progressive   degenerative changes in his hip, progressive problems and dysfunction   with regarding the hip prior to surgery.  Consent was obtained for   benefit of pain relief.  Specific risk of infection, DVT, component   failure, dislocation, need for revision surgery, as well discussion of   the anterior versus posterior approach were reviewed.  Consent was   obtained for benefit of anterior pain relief through an anterior   approach.      PROCEDURE IN DETAIL:  The patient was brought to operative theater.   Once adequate anesthesia,  preoperative antibiotics, 2gm of Ancef, 1 gm of Tranexamic Acid, and 10 mg of Decadron administered.   The patient was positioned supine on the OSI Hanna table.  Once adequate   padding of boney process was carried out, we had predraped out the hip, and  used fluoroscopy to confirm orientation of the pelvis and position.      The right hip was then prepped and draped from proximal iliac crest to   mid thigh with shower curtain technique.      Time-out was performed identifying the patient, planned procedure, and   extremity.     An incision was then made 2 cm distal and lateral to the   anterior superior iliac spine extending over the orientation of the   tensor fascia lata muscle and sharp dissection was carried down to the   fascia of the muscle and protractor placed in the soft tissues.      The fascia  was then incised.  The muscle belly was identified and swept   laterally and retractor placed along the superior neck.  Following   cauterization of the circumflex vessels and removing some pericapsular   fat, a second cobra retractor was placed on the inferior neck.  A third   retractor was placed on the anterior acetabulum after elevating the   anterior rectus.  A L-capsulotomy was along the line of the   superior neck to the trochanteric fossa, then extended proximally and   distally.  Tag sutures were placed and the retractors were then placed   intracapsular.  We then identified the trochanteric fossa and   orientation of my neck cut, confirmed this radiographically   and then made a neck osteotomy with the femur on traction.  The femoral   head was removed without difficulty or complication.  Traction was let   off and retractors were placed posterior and anterior around the   acetabulum.      The labrum and foveal tissue were debrided.  I began reaming with a 47mm   reamer and reamed up to 51mm reamer with good bony bed preparation and a 52mm   cup was chosen.  The final 52mm  Pinnacle cup was then impacted under fluoroscopy  to confirm the depth of penetration and orientation with respect to   abduction.  A screw was placed followed by the hole eliminator.  The final   36+4 neutral Altrex liner was impacted with good visualized rim fit.  The cup was positioned anatomically within the acetabular portion of the pelvis.      At this point, the femur was rolled at 80 degrees.  Further capsule was   released off the inferior aspect of the femoral neck.  I then   released the superior capsule proximally.  The hook was placed laterally   along the femur and elevated manually and held in position with the bed   hook.  The leg was then extended and adducted with the leg rolled to 100   degrees of external rotation.  Once the proximal femur was fully   exposed, I used a box osteotome to set orientation.  I then began   broaching with the starting chili pepper broach and passed this by hand and then broached up to 6.  With the 6 broach in place I chose a high offset neck and did a trial reduction.  The offset was appropriate, leg lengths   appeared to be equal, confirmed radiographically.   Given these findings, I went ahead and dislocated the hip, repositioned all   retractors and positioned the right hip in the extended and abducted position.  The final 6 Hi Tri Lock stem was   chosen and it was impacted down to the level of neck cut.  Based on this   and the trial reduction, a 36+1.5 delta ceramic ball was chosen and   impacted onto a clean and dry trunnion, and the hip was reduced.  The   hip had been irrigated throughout the case again at this point.  I did   reapproximate the superior capsular leaflet to the anterior leaflet   using #1 Vicryl.  The fascia of the   tensor fascia lata muscle was then reapproximated using #1 Vicryl and #0 V-lock sutures.  The   remaining wound was closed with 2-0 Vicryl and running 4-0 Monocryl.   The hip was cleaned, dried, and dressed  sterilely using Dermabond and  Aquacel dressing.  She was then brought   to recovery room in stable condition tolerating the procedure well.    Lanney Gins, PA-C was present for the entirety of the case involved from   preoperative positioning, perioperative retractor management, general   facilitation of the case, as well as primary wound closure as assistant.            Madlyn Frankel Charlann Boxer, M.D.        08/18/2014 11:29 AM

## 2014-08-19 LAB — CBC
HCT: 30 % — ABNORMAL LOW (ref 36.0–46.0)
HEMOGLOBIN: 9.6 g/dL — AB (ref 12.0–15.0)
MCH: 30 pg (ref 26.0–34.0)
MCHC: 32 g/dL (ref 30.0–36.0)
MCV: 93.8 fL (ref 78.0–100.0)
PLATELETS: 281 10*3/uL (ref 150–400)
RBC: 3.2 MIL/uL — ABNORMAL LOW (ref 3.87–5.11)
RDW: 13.1 % (ref 11.5–15.5)
WBC: 12.1 10*3/uL — ABNORMAL HIGH (ref 4.0–10.5)

## 2014-08-19 LAB — BASIC METABOLIC PANEL
Anion gap: 8 (ref 5–15)
BUN: 13 mg/dL (ref 6–20)
CO2: 28 mmol/L (ref 22–32)
CREATININE: 0.63 mg/dL (ref 0.44–1.00)
Calcium: 8.5 mg/dL — ABNORMAL LOW (ref 8.9–10.3)
Chloride: 103 mmol/L (ref 101–111)
Glucose, Bld: 123 mg/dL — ABNORMAL HIGH (ref 65–99)
Potassium: 4.2 mmol/L (ref 3.5–5.1)
Sodium: 139 mmol/L (ref 135–145)

## 2014-08-19 MED ORDER — POLYETHYLENE GLYCOL 3350 17 G PO PACK: 17.0000 g | PACK | Freq: Two times a day (BID) | ORAL | Status: DC

## 2014-08-19 MED ORDER — DOCUSATE SODIUM 100 MG PO CAPS: 100.0000 mg | ORAL_CAPSULE | Freq: Two times a day (BID) | ORAL | Status: DC

## 2014-08-19 MED ORDER — FERROUS SULFATE 325 (65 FE) MG PO TABS: 325.0000 mg | ORAL_TABLET | Freq: Three times a day (TID) | ORAL | Status: DC

## 2014-08-19 NOTE — Progress Notes (Signed)
     Subjective: 1 Day Post-Op Procedure(s) (LRB): RIGHT TOTAL HIP ARTHROPLASTY ANTERIOR APPROACH (Right)   Seen by Dr. Charlann Boxer. Patient reports pain as mild, pain controlled. No events throughout the night. Ready to be discharged home if she does well with PT and pain stays controlled.   Objective:   VITALS:   Filed Vitals:   08/19/14 0600  BP: 110/60  Pulse: 91  Temp: 98.3 F (36.8 C)  Resp: 16    Dorsiflexion/Plantar flexion intact Incision: dressing C/D/I No cellulitis present Compartment soft  LABS  Recent Labs  08/19/14 0500  HGB 9.6*  HCT 30.0*  WBC 12.1*  PLT 281     Recent Labs  08/19/14 0500  NA 139  K 4.2  BUN 13  CREATININE 0.63  GLUCOSE 123*     Assessment/Plan: 1 Day Post-Op Procedure(s) (LRB): RIGHT TOTAL HIP ARTHROPLASTY ANTERIOR APPROACH (Right) Foley cath d/c'ed Advance diet Up with therapy D/C IV fluids Discharge home with home health  Follow up in 2 weeks at [facility]. Follow up with OLIN,Stormie Ventola D in 2 weeks.  Contact information:  [facility] [address] [place] [address] [phone]    Overweight (BMI 25-29.9) Estimated body mass index is 28.96 kg/(m^2) as calculated from the following:   Height as of this encounter:  (1.753 m).   Weight as of this encounter: 89 kg (196 lb 3.4 oz). Patient also counseled that weight may inhibit the healing process Patient counseled that losing weight will help with future health issues         Anastasio Auerbach. Jame Morrell   PAC  08/19/2014, 8:49 AM

## 2014-08-19 NOTE — Progress Notes (Signed)
Physical Therapy Treatment Patient Details Name: Robyn Smith MRN: [id number] DOB: [birth year] Today's Date: 08/19/2014    History of Present Illness Pt is a 59 year old female s/p R THA direct anterior approach with PMHx of PVD, fibromyalgia, lumbar fusion    PT Comments    Pt ambulated and performed LE exercises in recliner.  Pt with more pain today so will need to return to practice steps this afternoon prior to d/c.  Follow Up Recommendations  Home health PT     Equipment Recommendations  None recommended by PT    Recommendations for Other Services       Precautions / Restrictions Precautions Precautions: Fall Precaution Comments: pt reports maintaining back precautions from previous lumbar fusion Restrictions RLE Weight Bearing: Weight bearing as tolerated    Mobility  Bed Mobility Overal bed mobility: Needs Assistance Bed Mobility: Rolling;Sidelying to Sit   Sidelying to sit: Supervision Supine to sit: Min assist     General bed mobility comments: pt performing log roll technique due to lumbar fusion hx, educated to place pillow between knees, assist for R LE due to pain  Transfers Overall transfer level: Needs assistance Equipment used: Rolling walker (2 wheeled) Transfers: Sit to/from Stand Sit to Stand: Min guard         General transfer comment: verbal cues for safe technique  Ambulation/Gait Ambulation/Gait assistance: Min guard Ambulation Distance (Feet): 40 Feet Assistive device: Rolling walker (2 wheeled) Gait Pattern/deviations: Step-to pattern;Antalgic;Trunk flexed     General Gait Details: verbal cues for sequence, step length, posture, RW distance, pt reporting more pain today   Stairs            Wheelchair Mobility    Modified Rankin (Stroke Patients Only)       Balance                                    Cognition Arousal/Alertness: Awake/alert Behavior During Therapy: WFL for tasks  assessed/performed Overall Cognitive Status: Within Functional Limits for tasks assessed                      Exercises Total Joint Exercises Ankle Circles/Pumps: AROM;15 reps;Both Quad Sets: AROM;Both;15 reps Towel Squeeze: AROM;Both;15 reps Short Arc Quad: AROM;Right;10 reps Heel Slides: AAROM;Right;10 reps Hip ABduction/ADduction: AAROM;Right;10 reps    General Comments        Pertinent Vitals/Pain Pain Assessment: 0-10 Pain Score: 5  Pain Location: R hip Pain Descriptors / Indicators: Aching;Sore Pain Intervention(s): Limited activity within patient's tolerance;Monitored during session;Premedicated before session;Repositioned;Ice applied    Home Living                      Prior Function            PT Goals (current goals can now be found in the care plan section) Progress towards PT goals: Progressing toward goals    Frequency  7X/week    PT Plan Current plan remains appropriate    Co-evaluation             End of Session Equipment Utilized During Treatment: Gait belt Activity Tolerance: Patient tolerated treatment well Patient left: with call bell/phone within reach;in chair     Time: 4098-1191 PT Time Calculation (min) (ACUTE ONLY): 24 min  Charges:  $Gait Training: 8-22 mins $Therapeutic Exercise: 8-22 mins  G Codes:      Robyn Smith,Robyn Smith 2014/08/29, 12:55 PM Robyn Smith, PT, DPT 08-29-2014 Pager: [phone]

## 2014-08-19 NOTE — Progress Notes (Signed)
Physical Therapy Treatment Note    08/19/14 1500  PT Visit Information  Last PT Received On 08/19/14  Assistance Needed +1  History of Present Illness Pt is a 59 year old female s/p R THA direct anterior approach with PMHx of PVD, fibromyalgia, lumbar fusion  PT Time Calculation  PT Start Time (ACUTE ONLY) 1323  PT Stop Time (ACUTE ONLY) 1336  PT Time Calculation (min) (ACUTE ONLY) 13 min  Subjective Data  Subjective Pt ambulated in hallway and practiced steps with spouse present and assisting with RW.  Pt had no further questions and feels ready for d/c home.  Precautions  Precautions Fall  Precaution Comments pt reports maintaining back precautions from previous lumbar fusion  Restrictions  RLE Weight Bearing WBAT  Pain Assessment  Pain Assessment 0-10  Pain Score 4  Pain Location R hip  Pain Descriptors / Indicators Aching;Sore  Pain Intervention(s) Limited activity within patient's tolerance;Monitored during session;Repositioned  Cognition  Arousal/Alertness Awake/alert  Behavior During Therapy WFL for tasks assessed/performed  Overall Cognitive Status Within Functional Limits for tasks assessed  Bed Mobility  Overal bed mobility Needs Assistance  Bed Mobility Rolling;Sidelying to Sit;Sit to Sidelying  Rolling Supervision  Sidelying to sit Supervision  Sit to sidelying Mod assist  General bed mobility comments assist for LEs  Transfers  Overall transfer level Needs assistance  Equipment used Rolling walker (2 wheeled)  Transfers Sit to/from Stand  Sit to Stand Supervision  General transfer comment for safety  Ambulation/Gait  Ambulation/Gait assistance Supervision  Ambulation Distance (Feet) 40 Feet  Assistive device Rolling walker (2 wheeled)  Gait Pattern/deviations Step-to pattern;Antalgic;Trunk flexed  General Gait Details verbal cues for step length, posture, RW distance  Stairs Yes  Stairs assistance Min guard  Stair Management Backwards;With walker;Step to  pattern  Number of Stairs 3  General stair comments verbal cues for RW positioning which spouse assisted with RW, sequence, safety  PT - End of Session  Activity Tolerance Patient tolerated treatment well  Patient left in bed;with call bell/phone within reach  PT - Assessment/Plan  PT Plan Current plan remains appropriate  PT Frequency (ACUTE ONLY) 7X/week  Follow Up Recommendations Home health PT  PT equipment None recommended by PT  PT Goal Progression  Progress towards PT goals Progressing toward goals  PT General Charges  $$ ACUTE PT VISIT 1 Procedure  PT Treatments  $Gait Training 8-22 mins   [name], PT, DPT 08/19/2014 Pager: [phone]

## 2014-08-19 NOTE — Care Management Note (Signed)
Case Management Note  Patient Details  Name: JON KASPAREK MRN: [id number] Date of Birth: [birth year]  Subjective/Objective:                   RIGHT TOTAL HIP ARTHROPLASTY ANTERIOR APPROACH (Right)  Action/Plan:  Discharge planning Expected Discharge Date:  08/19/14               Expected Discharge Plan:  [place]  In-House Referral:     Discharge planning Services  CM Consult  Post Acute Care Choice:    Choice offered to:  NA  DME Arranged:    DME Agency:     HH Arranged:  PT [place]:  [facility]  Status of Service:  Completed, signed off  Medicare Important Message Given:    Date Medicare IM Given:    Medicare IM give by:    Date Additional Medicare IM Given:    Additional Medicare Important Message give by:     If discussed at [place] of Stay Meetings, dates discussed:    Additional Comments: CM met with pt in room to offer choice of home health agency.  Pt chooses [facility]  Address and contact information verified by pt.  Cm called [facility] [place] who requested I fax facesheet, H&P, order, face to face, PT EVAl, OP note, and progress note to [phone].  CM faxed requested information.  Pt states she has both a rolling walker and 3n1.  NO other CM needs were communicated. Dellie Catholic, RN 08/19/2014, 10:20 AM

## 2014-08-19 NOTE — Evaluation (Signed)
Occupational Therapy Evaluation Patient Details Name: Robyn Smith MRN: [id number] DOB: October 01, 1955 Today's Date: 08/19/2014    History of Present Illness Pt is a 59 year old female s/p R THA direct anterior approach with PMHx of PVD, fibromyalgia, lumbar fusion   Clinical Impression   Pt was admitted for the above surgery. All education was completed.  No further OT is needed at this time    Follow Up Recommendations  Supervision/Assistance - 24 hour    Equipment Recommendations  None recommended by OT    Recommendations for Other Services       Precautions / Restrictions Precautions Precautions: Fall Precaution Comments: pt reports maintaining back precautions from previous lumbar fusion Restrictions RLE Weight Bearing: Weight bearing as tolerated      Mobility Bed Mobility Overal bed mobility: Needs Assistance Bed Mobility: Rolling;Sidelying to Sit       Sit to sidelying: Min assist General bed mobility comments: assist for legs  Transfers Overall transfer level: Needs assistance Equipment used: Rolling walker (2 wheeled) Transfers: Sit to/from Stand Sit to Stand: Min guard         General transfer comment: for safety    Balance                                            ADL Overall ADL's : Needs assistance/impaired     Grooming: Supervision/safety;Standing;Wash/dry Electrical engineer Transfer: Min guard;Ambulation;BSC;RW   Toileting- Clothing Manipulation and Hygiene: Supervision/safety;Sit to/from stand         General ADL Comments: pt's pain was 6 but she wanted to walk to bathroom now rather than waiting for next dose of medication.  Reviewed shower transfer but did not practice due to pain.  She has a Sports administrator and has used it for [employer]  Will have assistance from family also.      Vision     Perception     Praxis      Pertinent Vitals/Pain Pain Assessment: 0-10 Pain Score: 6  Pain  Location: right hip Pain Descriptors / Indicators: Aching Pain Intervention(s): Limited activity within patient's tolerance;Monitored during session;Premedicated before session;Repositioned;Ice applied     Hand Dominance     Extremity/Trunk Assessment Upper Extremity Assessment Upper Extremity Assessment: Overall WFL for tasks assessed           Communication Communication Communication: No difficulties   Cognition Arousal/Alertness: Awake/alert Behavior During Therapy: WFL for tasks assessed/performed Overall Cognitive Status: Within Functional Limits for tasks assessed                     General Comments       Exercises       Shoulder Instructions      Home Living Family/patient expects to be discharged to:: Private residence Living Arrangements: Spouse/significant other Available Help at Discharge: Family;Available 24 hours/day               Bathroom Shower/Tub: Producer, television/film/video: Standard     Home Equipment: Environmental consultant - 2 wheels;Bedside commode          Prior Functioning/Environment Level of Independence: Independent             OT Diagnosis: Acute pain   OT Problem List:  OT Treatment/Interventions:      OT Goals(Current goals can be found in the care plan section)    OT Frequency:     Barriers to D/C:            Co-evaluation              End of Session    Activity Tolerance: Patient tolerated treatment well Patient left: in bed;with call bell/phone within reach;with nursing/sitter in room   Time: 1223-1242 OT Time Calculation (min): 19 min Charges:  OT General Charges $OT Visit: 1 Procedure OT Evaluation $Initial OT Evaluation Tier I: 1 Procedure G-Codes:    Manuel Dall 2014-09-05, 1:08 PM Marica Otter, OTR/L [phone] 09/05/2014

## 2014-08-24 NOTE — Discharge Summary (Signed)
Physician Discharge Summary  Patient ID: Robyn Smith MRN: [id number] DOB/AGE: [birth year] 59 y.o.  Admit date: 08/18/2014 Discharge date: 08/19/2014   Procedures:  Procedure(s) (LRB): RIGHT TOTAL HIP ARTHROPLASTY ANTERIOR APPROACH (Right)  Attending Physician:  Dr. Durene Romans   Admission Diagnoses:   Right hip primary OA / pain  Discharge Diagnoses:  Principal Problem:   S/P right THA, AA  Past Medical History  Diagnosis Date  . Peripheral vascular disease   . Pneumonia     as a child  . Anxiety   . Fibromyalgia   . Arthritis   . PONV (postoperative nausea and vomiting)     "some" not too much  . GERD (gastroesophageal reflux disease)     mild  . Anemia     hx of, none since menapause  . Thin skin     on arms  . Venous reflux     surgery in oct and nov 2016    HPI:    Robyn Smith, 59 y.o. female, has a history of pain and functional disability in the right hip(s) due to arthritis and patient has failed non-surgical conservative treatments for greater than 12 weeks to include NSAID's and/or analgesics, corticosteriod injections and activity modification. Onset of symptoms was gradual starting 16+ years ago with gradually worsening course since that time.The patient noted no past surgery on the right hip(s). Patient currently rates pain in the right hip at 10 out of 10 with activity. Patient has night pain, worsening of pain with activity and weight bearing, trendelenberg gait, pain that interfers with activities of daily living and pain with passive range of motion. Patient has evidence of periarticular osteophytes and joint space narrowing by imaging studies. This condition presents safety issues increasing the risk of falls. There is no current active infection. Risks, benefits and expectations were discussed with the patient. Risks including but not limited to the risk of anesthesia, blood clots, nerve damage, blood vessel damage, failure of the prosthesis,  infection and up to and including death. Patient understand the risks, benefits and expectations and wishes to proceed with surgery.   PCP: Aniceto Boss, MD   Discharged Condition: good  Hospital Course:  Patient underwent the above stated procedure on 08/18/2014. Patient tolerated the procedure well and brought to the recovery room in good condition and subsequently to the floor.  POD #1 BP: 110/60 ; Pulse: 91 ; Temp: 98.3 F (36.8 C) ; Resp: 16 Patient reports pain as mild, pain controlled. No events throughout the night. Ready to be discharged home. Dorsiflexion/plantar flexion intact, incision: dressing C/D/I, no cellulitis present and compartment soft.   LABS  Basename    HGB  9.6  HCT  30.0    Discharge Exam: General appearance: alert, cooperative and no distress Extremities: Homans sign is negative, no sign of DVT, no edema, redness or tenderness in the calves or thighs and no ulcers, gangrene or trophic changes  Disposition: Home with follow up in 2 weeks   Follow-up Information    Follow up with Shelda Pal, MD. Schedule an appointment as soon as possible for a visit in 2 weeks.   Specialty:  Orthopedic Surgery   Contact information:   [address] [place] [address] [phone]       Follow up with [facility].   Why:  we have requested Joselyn Arrow for you for home health physical therapy   Contact information:   [phone]  Discharge Instructions    Call MD / Call 911    Complete by:  As directed   If you experience chest pain or shortness of breath, CALL 911 and be transported to the hospital emergency room.  If you develope a fever above 101 F, pus (white drainage) or increased drainage or redness at the wound, or calf pain, call your surgeon's office.     Change dressing    Complete by:  As directed   Maintain surgical dressing until follow up in the clinic. If the edges start to pull up, may reinforce with  tape. If the dressing is no longer working, may remove and cover with gauze and tape, but must keep the area dry and clean.  Call with any questions or concerns.     Constipation Prevention    Complete by:  As directed   Drink plenty of fluids.  Prune juice may be helpful.  You may use a stool softener, such as Colace (over the counter) 100 mg twice a day.  Use MiraLax (over the counter) for constipation as needed.     Diet - low sodium heart healthy    Complete by:  As directed      Discharge instructions    Complete by:  As directed   Maintain surgical dressing until follow up in the clinic. If the edges start to pull up, may reinforce with tape. If the dressing is no longer working, may remove and cover with gauze and tape, but must keep the area dry and clean.  Follow up in 2 weeks at [facility]. Call with any questions or concerns.     Increase activity slowly as tolerated    Complete by:  As directed   Weight bearing as tolerated with assist device (walker, cane, etc) as directed, use it as long as suggested by your surgeon or therapist, typically at least 4-6 weeks.     TED hose    Complete by:  As directed   Use stockings (TED hose) for 2 weeks on both leg(s).  You may remove them at night for sleeping.             Medication List    STOP taking these medications        diclofenac 75 MG EC tablet  Commonly known as:  VOLTAREN      TAKE these medications        ALPRAZolam 0.25 MG tablet  Commonly known as:  XANAX  Take 0.25 mg by mouth 2 (two) times daily as needed for anxiety or sleep.     aspirin EC 325 MG tablet  Take 1 tablet (325 mg total) by mouth 2 (two) times daily. Take for 4 weeks.     cetirizine 10 MG tablet  Commonly known as:  ZYRTEC  Take 10 mg by mouth daily.     clonazePAM 0.5 MG tablet  Commonly known as:  KLONOPIN  Take 0.5 mg by mouth 2 (two) times daily.     docusate sodium 100 MG capsule  Commonly known as:  COLACE  Take 1  capsule (100 mg total) by mouth 2 (two) times daily.     ferrous sulfate 325 (65 FE) MG tablet  Take 1 tablet (325 mg total) by mouth 3 (three) times daily after meals.     methocarbamol 500 MG tablet  Commonly known as:  ROBAXIN  Take 1 tablet (500 mg total) by mouth every 6 (six) hours as needed for muscle spasms.  oxyCODONE 5 MG immediate release tablet  Commonly known as:  Oxy IR/ROXICODONE  Take 1-3 tablets (5-15 mg total) by mouth every 4 (four) hours as needed for moderate pain or severe pain.     polyethylene glycol packet  Commonly known as:  MIRALAX / GLYCOLAX  Take 17 g by mouth 2 (two) times daily.     VITAMIN C PO  Take 1 tablet by mouth daily.         Signed: Anastasio Auerbach. Shiela Bruns   PA-C  08/24/2014, 2:25 PM

## 2016-02-29 ENCOUNTER — Other Ambulatory Visit: Payer: Self-pay | Admitting: Neurosurgery

## 2016-02-29 DIAGNOSIS — M542 Cervicalgia: Secondary | ICD-10-CM

## 2016-02-29 DIAGNOSIS — M5416 Radiculopathy, lumbar region: Secondary | ICD-10-CM

## 2016-03-06 ENCOUNTER — Ambulatory Visit
Admission: RE | Admit: 2016-03-06 | Discharge: 2016-03-06 | Disposition: A | Discharge status: Home / Self Care | Payer: BLUE CROSS/BLUE SHIELD | Source: Ambulatory Visit | Attending: Neurosurgery | Admitting: Neurosurgery

## 2016-03-06 ENCOUNTER — Other Ambulatory Visit: Payer: BLUE CROSS/BLUE SHIELD

## 2016-03-06 DIAGNOSIS — M542 Cervicalgia: Secondary | ICD-10-CM

## 2016-03-06 DIAGNOSIS — M5416 Radiculopathy, lumbar region: Secondary | ICD-10-CM

## 2016-03-06 MED ORDER — DIAZEPAM 5 MG PO TABS
10.0000 mg | ORAL_TABLET | Freq: Once | ORAL | Status: AC
  Administered 2016-03-06: 10 mg via ORAL

## 2016-03-06 MED ORDER — IOPAMIDOL (ISOVUE-M 300) INJECTION 61%
9.0000 mL | Freq: Once | INTRAMUSCULAR | Status: AC | PRN
  Administered 2016-03-06: 9 mL via INTRATHECAL

## 2016-03-06 NOTE — Discharge Instructions (Signed)

## 2016-03-08 NOTE — Progress Notes (Signed)
Pt states she did very well and had told her husband how nice everyone at this facility

## 2017-01-09 HISTORY — PX: REPLACEMENT TOTAL KNEE: SUR1224

## 2017-06-22 ENCOUNTER — Other Ambulatory Visit: Payer: Self-pay | Admitting: Neurosurgery

## 2017-08-07 NOTE — H&P (Signed)
Patient ID:   [id number] Patient: Robyn Smith  Date of Birth: May 25, 1955 Visit Type: Office Visit   Date: 06/20/2017 11:00 AM Provider: Danae Orleans. Venetia Maxon MD   This 62 year old female presents for MRI review.  HISTORY OF PRESENT ILLNESS:  1.  MRI review  Pt returns to review her MRI  Lumbar MRI demonstrates anterolisthesis L5 on S1 with right greater than left L5 nerve root compression.  The patient has significant compressive pathology the L5-S1 level which I believe is the basis for her right leg pain.  I have suggested to surgical options 1 would be a LIF at L5-S1 with percutaneous posterior pedicle screw fixation 2nd option would be posterior decompression and fusion at the L5-S1 level.    I am going to take some scoliosis film measurements to clarify need for restoration of lordosis.  We discussed the nature of the surgical procedures and the patient was fitted for an LSO brace.  She was given a prescription for for a brace.  On repeat examination today the patient has hip adductor weakness on the right at 4/5 and on the left at 4+ out of 5. Right EHL is 4/5 and left EHL is 4+ out of 5.          PAST MEDICAL HISTORY, SURGICAL HISTORY, FAMILY HISTORY, SOCIAL HISTORY AND REVIEW OF SYSTEMS I have reviewed the patient's past medical, surgical, family and social history as well as the comprehensive review of systems as included on the [facility] history form dated 06/06/2017, which I have signed.   MEDICATIONS: (added, continued or stopped this visit) Started Medication Directions Instruction Stopped   Lidocaine Viscous 2 % mucosal solution take 15 milliliter by oral route  every 3 hours and swish and spit out    06/20/2017 Percocet 10 mg-325 mg tablet take 1 tablet by oral route  every 6-8 hours as needed    05/24/2017 Percocet 10 mg-325 mg tablet take 1 tablet by oral route  every 6-8 hours as needed  06/20/2017     ALLERGIES: Ingredient  Reaction Medication Name Comment  PENICILLINS      Reviewed, no changes.    PHYSICAL EXAM:   Vitals Date Temp F BP Pulse Ht In Wt Lb BMI BSA Pain Score  06/20/2017  117/73 65 69 189 27.91  7/10      IMPRESSION:   Spondylolisthesis L5-S1 with stenosis and right greater than left nerve root compression.  PLAN:  Patient will undergo decompression and fusion L5-S1 level.  Orders: Diagnostic Procedures: Assessment Procedure  M54.16 Lumbar Spine- AP/Lat  Miscellaneous: Assessment   M43.16 LSO Brace   Assessment/Plan   # Detail Type Description   1. Assessment Other idiopathic scoliosis, site unspecified (M41.20).       2. Assessment Spondylolisthesis, lumbar region (M43.16).   Plan Orders LSO Brace.       3. Assessment Radiculopathy, lumbar region (M54.16).       4. 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         Pain Management Plan Pain Scale: 7/10. Method: Numeric Pain Intensity Scale. Onset: 08/19/2016.     MEDICATIONS PRESCRIBED TODAY    Rx Quantity Refills  PERCOCET 10 mg-325 mg  100 0            Provider:  Venetia Maxon MD, Danae Orleans 06/24/2017 2:11 PM  Dictation edited by: Danae Orleans. Venetia Maxon    CC Providers: Salvatore Decent [address] [place]  [phone]   Carlota RaspberryEric Davidson  [address] [place], [place] [phone]-              Electronically signed by Danae OrleansJoseph D. Venetia MaxonStern MD on 06/24/2017 02:11 PM

## 2017-08-13 ENCOUNTER — Encounter (HOSPITAL_COMMUNITY): Payer: Self-pay

## 2017-08-13 NOTE — Pre-Procedure Instructions (Signed)
Kassie MendsLinda T Fennell  08/13/2017      [facility] [place] - [address] [place] [id number] Phone: [phone] Fax: [phone]    Your procedure is scheduled on  Friday  08/17/17  [facility] Admitting at 530 A.M.  Call this number if you have problems the morning of surgery:  [phone]   Remember:  Do not eat or drink after midnight.      Take these medicines the morning of surgery with A SIP OF WATER - oxycodone if needed   7 days prior to surgery STOP taking any Aspirin(unless otherwise instructed by your surgeon), Aleve, Naproxen, Ibuprofen, Motrin, Advil, Goody's, BC's, all herbal medications, fish oil, and all vitamins   Do not wear jewelry, make-up or nail polish.  Do not wear lotions, powders, or perfumes, or deodorant.  Do not shave 48 hours prior to surgery.  Men may shave face and neck.  Do not bring valuables to the hospital.  [facility] is not responsible for any belongings or valuables.  Contacts, dentures or bridgework may not be worn into surgery.  Leave your suitcase in the car.  After surgery it may be brought to your room.  For patients admitted to the hospital, discharge time will be determined by your treatment team.  Patients discharged the day of surgery will not be allowed to drive home.   Name and phone number of your driver:   Special instructions:  [facility] - Preparing for Surgery  Before surgery, you can play an important role.  Because skin is not sterile, your skin needs to be as free of germs as possible.  You can reduce the number of germs on you skin by washing with CHG (chlorahexidine gluconate) soap before surgery.  CHG is an antiseptic cleaner which kills germs and bonds with the skin to continue killing germs even after washing.  Oral Hygiene is also important in reducing the risk of infection.  Remember to brush your teeth with your regular toothpaste the morning of  surgery.  Please DO NOT use if you have an allergy to CHG or antibacterial soaps.  If your skin becomes reddened/irritated stop using the CHG and inform your nurse when you arrive at Short Stay.  Do not shave (including legs and underarms) for at least 48 hours prior to the first CHG shower.  You may shave your face.  Please follow these instructions carefully:   1.  Shower with CHG Soap the night before surgery and the morning of Surgery.  2.  If you choose to wash your hair, wash your hair first as usual with your normal shampoo.  3.  After you shampoo, rinse your hair and body thoroughly to remove the shampoo. 4.  Use CHG as you would any other liquid soap.  You can apply chg directly to the skin and wash gently with a      scrungie or washcloth.           5.  Apply the CHG Soap to your body ONLY FROM THE NECK DOWN.   Do not use on open wounds or open sores. Avoid contact with your eyes, ears, mouth and genitals (private parts).  Wash genitals (private parts) with your normal soap.  6.  Wash thoroughly, paying special attention to the area where your surgery will be performed.  7.  Thoroughly rinse your body with warm water from the neck down.  8.  DO NOT shower/wash with your normal soap after using and rinsing off the CHG Soap.  9.  Pat yourself dry with a clean towel.            10.  Wear clean pajamas.            11.  Place clean sheets on your bed the night of your first shower and do not sleep with pets.  Day of Surgery  Do not apply any lotions/deoderants the morning of surgery.   Please wear clean clothes to the hospital/surgery center. Remember to brush your teeth with toothpaste.     Please read over the following fact sheets that you were given. Pain Booklet, MRSA Information and Surgical Site Infection Prevention

## 2017-08-14 ENCOUNTER — Encounter (HOSPITAL_COMMUNITY): Payer: Self-pay

## 2017-08-14 ENCOUNTER — Encounter (HOSPITAL_COMMUNITY)
Admission: RE | Admit: 2017-08-14 | Discharge: 2017-08-14 | Disposition: A | Discharge status: Home / Self Care | Payer: Medicare Other | Source: Ambulatory Visit | Attending: Neurosurgery | Admitting: Neurosurgery

## 2017-08-14 ENCOUNTER — Other Ambulatory Visit: Payer: Self-pay

## 2017-08-14 HISTORY — DX: Glossodynia: K14.6

## 2017-08-14 LAB — CBC
HCT: 39.5 % (ref 36.0–46.0)
Hemoglobin: 12.8 g/dL (ref 12.0–15.0)
MCH: 31 pg (ref 26.0–34.0)
MCHC: 32.4 g/dL (ref 30.0–36.0)
MCV: 95.6 fL (ref 78.0–100.0)
PLATELETS: 259 10*3/uL (ref 150–400)
RBC: 4.13 MIL/uL (ref 3.87–5.11)
RDW: 12.8 % (ref 11.5–15.5)
WBC: 7.9 10*3/uL (ref 4.0–10.5)

## 2017-08-14 LAB — BASIC METABOLIC PANEL
ANION GAP: 7 (ref 5–15)
BUN: 13 mg/dL (ref 8–23)
CALCIUM: 9.1 mg/dL (ref 8.9–10.3)
CO2: 27 mmol/L (ref 22–32)
CREATININE: 0.81 mg/dL (ref 0.44–1.00)
Chloride: 107 mmol/L (ref 98–111)
GLUCOSE: 98 mg/dL (ref 70–99)
Potassium: 3.9 mmol/L (ref 3.5–5.1)
Sodium: 141 mmol/L (ref 135–145)

## 2017-08-14 LAB — TYPE AND SCREEN
ABO/RH(D): O POS
ANTIBODY SCREEN: NEGATIVE

## 2017-08-14 LAB — SURGICAL PCR SCREEN
MRSA, PCR: NEGATIVE
STAPHYLOCOCCUS AUREUS: NEGATIVE

## 2017-08-16 NOTE — Anesthesia Preprocedure Evaluation (Addendum)
Anesthesia Evaluation  Patient identified by MRN, date of birth, ID band Patient awake    Reviewed: Allergy & Precautions, H&P , NPO status , Patient's Chart, lab work & pertinent test results  History of Anesthesia Complications (+) PONV  Airway Mallampati: II  TM Distance: >3 FB Neck ROM: Full    Dental no notable dental hx. (+) Teeth Intact, Dental Advisory Given   Pulmonary neg pulmonary ROS,    Pulmonary exam normal breath sounds clear to auscultation       Cardiovascular Exercise Tolerance: Good + Peripheral Vascular Disease  negative cardio ROS   Rhythm:Regular Rate:Normal     Neuro/Psych Anxiety negative neurological ROS     GI/Hepatic Neg liver ROS, GERD  Controlled,  Endo/Other  negative endocrine ROS  Renal/GU negative Renal ROS  negative genitourinary   Musculoskeletal  (+) Arthritis , Osteoarthritis,  Fibromyalgia -  Abdominal   Peds  Hematology negative hematology ROS (+)   Anesthesia Other Findings   Reproductive/Obstetrics negative OB ROS                            Anesthesia Physical Anesthesia Plan  ASA: II  Anesthesia Plan: General   Post-op Pain Management:    Induction: Intravenous  PONV Risk Score and Plan: 4 or greater and Ondansetron, Dexamethasone, Midazolam and Scopolamine patch - Pre-op  Airway Management Planned: Oral ETT  Additional Equipment:   Intra-op Plan:   Post-operative Plan: Extubation in OR  Informed Consent: I have reviewed the patients History and Physical, chart, labs and discussed the procedure including the risks, benefits and alternatives for the proposed anesthesia with the patient or authorized representative who has indicated his/her understanding and acceptance.   Dental advisory given  Plan Discussed with: CRNA  Anesthesia Plan Comments:         Anesthesia Quick Evaluation

## 2017-08-17 ENCOUNTER — Inpatient Hospital Stay (HOSPITAL_COMMUNITY)
Admission: RE | Admit: 2017-08-17 | Discharge: 2017-08-18 | DRG: 455 | Disposition: A | Discharge status: Home / Self Care | Payer: Medicare Other | Attending: Neurosurgery | Admitting: Neurosurgery

## 2017-08-17 ENCOUNTER — Encounter (HOSPITAL_COMMUNITY)
Admission: RE | Disposition: A | Discharge status: Home / Self Care | Payer: Self-pay | Source: Home / Self Care | Attending: Neurosurgery

## 2017-08-17 ENCOUNTER — Inpatient Hospital Stay (HOSPITAL_COMMUNITY): Payer: Medicare Other

## 2017-08-17 ENCOUNTER — Encounter (HOSPITAL_COMMUNITY): Payer: Self-pay | Admitting: *Deleted

## 2017-08-17 ENCOUNTER — Inpatient Hospital Stay (HOSPITAL_COMMUNITY): Payer: Medicare Other | Admitting: Anesthesiology

## 2017-08-17 DIAGNOSIS — M5116 Intervertebral disc disorders with radiculopathy, lumbar region: Secondary | ICD-10-CM | POA: Diagnosis present

## 2017-08-17 DIAGNOSIS — M4316 Spondylolisthesis, lumbar region: Principal | ICD-10-CM | POA: Diagnosis present

## 2017-08-17 DIAGNOSIS — Z419 Encounter for procedure for purposes other than remedying health state, unspecified: Secondary | ICD-10-CM

## 2017-08-17 DIAGNOSIS — M4120 Other idiopathic scoliosis, site unspecified: Secondary | ICD-10-CM | POA: Diagnosis not present

## 2017-08-17 DIAGNOSIS — M48062 Spinal stenosis, lumbar region with neurogenic claudication: Secondary | ICD-10-CM | POA: Diagnosis not present

## 2017-08-17 SURGERY — POSTERIOR LUMBAR FUSION 1 LEVEL
Anesthesia: General | Site: Back

## 2017-08-17 MED ORDER — METHOCARBAMOL 1000 MG/10ML IJ SOLN
500.0000 mg | Freq: Four times a day (QID) | INTRAVENOUS | Status: DC | PRN
  Filled 2017-08-17: qty 5

## 2017-08-17 MED ORDER — LIDOCAINE VISCOUS HCL 2 % MT SOLN
5.0000 mL | Freq: Four times a day (QID) | OROMUCOSAL | Status: DC | PRN
  Administered 2017-08-17: 5 mL via OROMUCOSAL
  Filled 2017-08-17 (×2): qty 15

## 2017-08-17 MED ORDER — HYDROCODONE-ACETAMINOPHEN 10-325 MG PO TABS: 1.0000 | ORAL_TABLET | ORAL | Status: DC | PRN

## 2017-08-17 MED ORDER — SCOPOLAMINE 1 MG/3DAYS TD PT72
MEDICATED_PATCH | TRANSDERMAL | Status: AC
  Administered 2017-08-17: 1.5 mg via TRANSDERMAL
  Filled 2017-08-17: qty 1

## 2017-08-17 MED ORDER — PHENYLEPHRINE 40 MCG/ML (10ML) SYRINGE FOR IV PUSH (FOR BLOOD PRESSURE SUPPORT)
PREFILLED_SYRINGE | INTRAVENOUS | Status: AC
  Filled 2017-08-17: qty 10

## 2017-08-17 MED ORDER — ZOLPIDEM TARTRATE 5 MG PO TABS: 5.0000 mg | ORAL_TABLET | Freq: Every evening | ORAL | Status: DC | PRN

## 2017-08-17 MED ORDER — VANCOMYCIN HCL IN DEXTROSE 1-5 GM/200ML-% IV SOLN
1000.0000 mg | Freq: Once | INTRAVENOUS | Status: AC
  Administered 2017-08-17: 1000 mg via INTRAVENOUS
  Filled 2017-08-17: qty 200

## 2017-08-17 MED ORDER — BISACODYL 10 MG RE SUPP: 10.0000 mg | Freq: Every day | RECTAL | Status: DC | PRN

## 2017-08-17 MED ORDER — OXYCODONE HCL 5 MG PO TABS
ORAL_TABLET | ORAL | Status: AC
  Administered 2017-08-17: 5 mg via ORAL
  Filled 2017-08-17: qty 1

## 2017-08-17 MED ORDER — OXYCODONE HCL 5 MG PO TABS
5.0000 mg | ORAL_TABLET | ORAL | Status: DC | PRN
  Administered 2017-08-17: 5 mg via ORAL

## 2017-08-17 MED ORDER — DEXAMETHASONE SODIUM PHOSPHATE 10 MG/ML IJ SOLN
INTRAMUSCULAR | Status: AC
  Filled 2017-08-17: qty 1

## 2017-08-17 MED ORDER — SCOPOLAMINE 1 MG/3DAYS TD PT72
1.0000 | MEDICATED_PATCH | TRANSDERMAL | Status: DC
  Administered 2017-08-17: 1.5 mg via TRANSDERMAL

## 2017-08-17 MED ORDER — BUPIVACAINE LIPOSOME 1.3 % IJ SUSP
20.0000 mL | INTRAMUSCULAR | Status: AC
  Administered 2017-08-17: 20 mL
  Filled 2017-08-17: qty 20

## 2017-08-17 MED ORDER — ONDANSETRON HCL 4 MG/2ML IJ SOLN
INTRAMUSCULAR | Status: DC | PRN
  Administered 2017-08-17: 4 mg via INTRAVENOUS

## 2017-08-17 MED ORDER — PHENOL 1.4 % MT LIQD: 1.0000 | OROMUCOSAL | Status: DC | PRN

## 2017-08-17 MED ORDER — ACETAMINOPHEN 10 MG/ML IV SOLN
INTRAVENOUS | Status: DC | PRN
  Administered 2017-08-17: 1000 mg via INTRAVENOUS

## 2017-08-17 MED ORDER — SODIUM CHLORIDE 0.9% FLUSH: 3.0000 mL | INTRAVENOUS | Status: DC | PRN

## 2017-08-17 MED ORDER — ACETAMINOPHEN 650 MG RE SUPP: 650.0000 mg | RECTAL | Status: DC | PRN

## 2017-08-17 MED ORDER — SUGAMMADEX SODIUM 200 MG/2ML IV SOLN
INTRAVENOUS | Status: DC | PRN
  Administered 2017-08-17: 200 mg via INTRAVENOUS

## 2017-08-17 MED ORDER — FLEET ENEMA 7-19 GM/118ML RE ENEM: 1.0000 | ENEMA | Freq: Once | RECTAL | Status: DC | PRN

## 2017-08-17 MED ORDER — DEXAMETHASONE SODIUM PHOSPHATE 10 MG/ML IJ SOLN
INTRAMUSCULAR | Status: DC | PRN
  Administered 2017-08-17: 10 mg via INTRAVENOUS

## 2017-08-17 MED ORDER — LIDOCAINE 2% (20 MG/ML) 5 ML SYRINGE
INTRAMUSCULAR | Status: DC | PRN
  Administered 2017-08-17: 60 mg via INTRAVENOUS

## 2017-08-17 MED ORDER — 0.9 % SODIUM CHLORIDE (POUR BTL) OPTIME
TOPICAL | Status: DC | PRN
  Administered 2017-08-17: 1000 mL

## 2017-08-17 MED ORDER — HYDROMORPHONE HCL 1 MG/ML IJ SOLN
INTRAMUSCULAR | Status: AC
  Administered 2017-08-17: 0.5 mg via INTRAVENOUS
  Filled 2017-08-17: qty 1

## 2017-08-17 MED ORDER — LACTATED RINGERS IV SOLN
INTRAVENOUS | Status: DC
  Administered 2017-08-17 (×3): via INTRAVENOUS

## 2017-08-17 MED ORDER — FENTANYL CITRATE (PF) 250 MCG/5ML IJ SOLN
INTRAMUSCULAR | Status: DC | PRN
  Administered 2017-08-17 (×5): 50 ug via INTRAVENOUS

## 2017-08-17 MED ORDER — DIPHENHYDRAMINE HCL 50 MG/ML IJ SOLN
INTRAMUSCULAR | Status: DC | PRN
  Administered 2017-08-17: 12.5 mg via INTRAVENOUS

## 2017-08-17 MED ORDER — PHENYLEPHRINE HCL 10 MG/ML IJ SOLN
INTRAMUSCULAR | Status: DC | PRN
  Administered 2017-08-17: 80 ug via INTRAVENOUS

## 2017-08-17 MED ORDER — PANTOPRAZOLE SODIUM 40 MG PO TBEC
40.0000 mg | DELAYED_RELEASE_TABLET | Freq: Every day | ORAL | Status: DC
  Administered 2017-08-17 – 2017-08-18 (×2): 40 mg via ORAL
  Filled 2017-08-17 (×2): qty 1

## 2017-08-17 MED ORDER — DIPHENHYDRAMINE HCL 50 MG/ML IJ SOLN
INTRAMUSCULAR | Status: AC
  Filled 2017-08-17: qty 1

## 2017-08-17 MED ORDER — ONDANSETRON HCL 4 MG/2ML IJ SOLN
INTRAMUSCULAR | Status: AC
  Filled 2017-08-17: qty 2

## 2017-08-17 MED ORDER — METHOCARBAMOL 500 MG PO TABS
500.0000 mg | ORAL_TABLET | Freq: Four times a day (QID) | ORAL | Status: DC | PRN
  Administered 2017-08-17 – 2017-08-18 (×4): 500 mg via ORAL
  Filled 2017-08-17 (×3): qty 1

## 2017-08-17 MED ORDER — PHENYLEPHRINE HCL 10 MG/ML IJ SOLN
INTRAMUSCULAR | Status: DC | PRN
  Administered 2017-08-17: 15 ug/min via INTRAVENOUS

## 2017-08-17 MED ORDER — FENTANYL CITRATE (PF) 250 MCG/5ML IJ SOLN
INTRAMUSCULAR | Status: AC
  Filled 2017-08-17: qty 5

## 2017-08-17 MED ORDER — PROPOFOL 10 MG/ML IV BOLUS
INTRAVENOUS | Status: AC
  Filled 2017-08-17: qty 20

## 2017-08-17 MED ORDER — LIDOCAINE 2% (20 MG/ML) 5 ML SYRINGE
INTRAMUSCULAR | Status: AC
  Filled 2017-08-17: qty 5

## 2017-08-17 MED ORDER — HEMOSTATIC AGENTS (NO CHARGE) OPTIME
TOPICAL | Status: DC | PRN
  Administered 2017-08-17: 1 via TOPICAL

## 2017-08-17 MED ORDER — LIDOCAINE-EPINEPHRINE 1 %-1:100000 IJ SOLN
INTRAMUSCULAR | Status: DC | PRN
  Administered 2017-08-17: 10 mL

## 2017-08-17 MED ORDER — GLYCOPYRROLATE 0.2 MG/ML IJ SOLN
INTRAMUSCULAR | Status: DC | PRN
  Administered 2017-08-17: 0.1 mg via INTRAVENOUS

## 2017-08-17 MED ORDER — METHOCARBAMOL 500 MG PO TABS
ORAL_TABLET | ORAL | Status: AC
  Administered 2017-08-17: 500 mg via ORAL
  Filled 2017-08-17: qty 1

## 2017-08-17 MED ORDER — BUPIVACAINE HCL (PF) 0.5 % IJ SOLN
INTRAMUSCULAR | Status: DC | PRN
  Administered 2017-08-17: 10 mL

## 2017-08-17 MED ORDER — ACETAMINOPHEN 325 MG PO TABS: 650.0000 mg | ORAL_TABLET | ORAL | Status: DC | PRN

## 2017-08-17 MED ORDER — KCL IN DEXTROSE-NACL 20-5-0.45 MEQ/L-%-% IV SOLN: INTRAVENOUS | Status: DC

## 2017-08-17 MED ORDER — POLYETHYLENE GLYCOL 3350 17 G PO PACK: 17.0000 g | PACK | Freq: Every day | ORAL | Status: DC | PRN

## 2017-08-17 MED ORDER — ONDANSETRON HCL 4 MG PO TABS: 4.0000 mg | ORAL_TABLET | Freq: Four times a day (QID) | ORAL | Status: DC | PRN

## 2017-08-17 MED ORDER — VANCOMYCIN HCL IN DEXTROSE 1-5 GM/200ML-% IV SOLN
INTRAVENOUS | Status: AC
  Administered 2017-08-17: 1000 mg via INTRAVENOUS
  Filled 2017-08-17: qty 200

## 2017-08-17 MED ORDER — ROCURONIUM BROMIDE 10 MG/ML (PF) SYRINGE
PREFILLED_SYRINGE | INTRAVENOUS | Status: AC
  Filled 2017-08-17: qty 10

## 2017-08-17 MED ORDER — DOCUSATE SODIUM 100 MG PO CAPS
100.0000 mg | ORAL_CAPSULE | Freq: Two times a day (BID) | ORAL | Status: DC
  Administered 2017-08-17: 100 mg via ORAL
  Filled 2017-08-17: qty 1

## 2017-08-17 MED ORDER — CHLORHEXIDINE GLUCONATE CLOTH 2 % EX PADS: 6.0000 | MEDICATED_PAD | Freq: Once | CUTANEOUS | Status: DC

## 2017-08-17 MED ORDER — ACETAMINOPHEN 10 MG/ML IV SOLN
INTRAVENOUS | Status: AC
  Filled 2017-08-17: qty 100

## 2017-08-17 MED ORDER — ROCURONIUM BROMIDE 10 MG/ML (PF) SYRINGE
PREFILLED_SYRINGE | INTRAVENOUS | Status: DC | PRN
  Administered 2017-08-17: 50 mg via INTRAVENOUS
  Administered 2017-08-17: 20 mg via INTRAVENOUS

## 2017-08-17 MED ORDER — OXYCODONE HCL 5 MG PO TABS
10.0000 mg | ORAL_TABLET | ORAL | Status: DC | PRN
  Administered 2017-08-17 – 2017-08-18 (×5): 10 mg via ORAL
  Filled 2017-08-17 (×5): qty 2

## 2017-08-17 MED ORDER — PROPOFOL 10 MG/ML IV BOLUS
INTRAVENOUS | Status: DC | PRN
  Administered 2017-08-17: 100 mg via INTRAVENOUS

## 2017-08-17 MED ORDER — THROMBIN (RECOMBINANT) 20000 UNITS EX SOLR
CUTANEOUS | Status: AC
  Filled 2017-08-17: qty 20000

## 2017-08-17 MED ORDER — MIDAZOLAM HCL 2 MG/2ML IJ SOLN
INTRAMUSCULAR | Status: AC
  Filled 2017-08-17: qty 2

## 2017-08-17 MED ORDER — SODIUM CHLORIDE 0.9% FLUSH
3.0000 mL | Freq: Two times a day (BID) | INTRAVENOUS | Status: DC
  Administered 2017-08-17: 3 mL via INTRAVENOUS

## 2017-08-17 MED ORDER — VANCOMYCIN HCL IN DEXTROSE 1-5 GM/200ML-% IV SOLN
1000.0000 mg | INTRAVENOUS | Status: AC
  Administered 2017-08-17: 1000 mg via INTRAVENOUS

## 2017-08-17 MED ORDER — HYDROMORPHONE HCL 1 MG/ML IJ SOLN
0.2500 mg | INTRAMUSCULAR | Status: DC | PRN
  Administered 2017-08-17 (×2): 0.5 mg via INTRAVENOUS

## 2017-08-17 MED ORDER — BUPIVACAINE HCL (PF) 0.5 % IJ SOLN
INTRAMUSCULAR | Status: AC
  Filled 2017-08-17: qty 30

## 2017-08-17 MED ORDER — ONDANSETRON HCL 4 MG/2ML IJ SOLN: 4.0000 mg | Freq: Four times a day (QID) | INTRAMUSCULAR | Status: DC | PRN

## 2017-08-17 MED ORDER — ALUM & MAG HYDROXIDE-SIMETH 200-200-20 MG/5ML PO SUSP: 30.0000 mL | Freq: Four times a day (QID) | ORAL | Status: DC | PRN

## 2017-08-17 MED ORDER — LIDOCAINE-EPINEPHRINE 1 %-1:100000 IJ SOLN
INTRAMUSCULAR | Status: AC
  Filled 2017-08-17: qty 1

## 2017-08-17 MED ORDER — MORPHINE SULFATE (PF) 2 MG/ML IV SOLN: 2.0000 mg | INTRAVENOUS | Status: DC | PRN

## 2017-08-17 MED ORDER — MIDAZOLAM HCL 2 MG/2ML IJ SOLN
INTRAMUSCULAR | Status: DC | PRN
  Administered 2017-08-17: 2 mg via INTRAVENOUS

## 2017-08-17 MED ORDER — HYDROCODONE-ACETAMINOPHEN 10-325 MG PO TABS: 2.0000 | ORAL_TABLET | ORAL | Status: DC | PRN

## 2017-08-17 MED ORDER — MENTHOL 3 MG MT LOZG: 1.0000 | LOZENGE | OROMUCOSAL | Status: DC | PRN

## 2017-08-17 SURGICAL SUPPLY — 76 items
BASKET BONE COLLECTION (BASKET) ×3 IMPLANT
BLADE CLIPPER SURG (BLADE) IMPLANT
BONE CANC CHIPS 20CC PCAN1/4 (Bone Implant) ×3 IMPLANT
BUR MATCHSTICK NEURO 3.0 LAGG (BURR) ×3 IMPLANT
BUR PRECISION FLUTE 5.0 (BURR) ×3 IMPLANT
CAGE COROENT 8X9X23 MP LRG (Spacer) ×6 IMPLANT
CANISTER SUCT 3000ML PPV (MISCELLANEOUS) ×3 IMPLANT
CARTRIDGE OIL MAESTRO DRILL (MISCELLANEOUS) ×1 IMPLANT
CHIPS CANC BONE 20CC PCAN1/4 (Bone Implant) ×1 IMPLANT
CONT SPEC 4OZ CLIKSEAL STRL BL (MISCELLANEOUS) ×3 IMPLANT
COVER BACK TABLE 60X90IN (DRAPES) ×3 IMPLANT
DECANTER SPIKE VIAL GLASS SM (MISCELLANEOUS) ×3 IMPLANT
DERMABOND ADVANCED (GAUZE/BANDAGES/DRESSINGS) ×2
DERMABOND ADVANCED .7 DNX12 (GAUZE/BANDAGES/DRESSINGS) ×1 IMPLANT
DIFFUSER DRILL AIR PNEUMATIC (MISCELLANEOUS) ×3 IMPLANT
DRAPE C-ARM 42X72 X-RAY (DRAPES) ×3 IMPLANT
DRAPE C-ARMOR (DRAPES) ×3 IMPLANT
DRAPE LAPAROTOMY 100X72X124 (DRAPES) ×3 IMPLANT
DRAPE SURG 17X23 STRL (DRAPES) ×3 IMPLANT
DRSG OPSITE POSTOP 4X6 (GAUZE/BANDAGES/DRESSINGS) ×3 IMPLANT
DURAPREP 26ML APPLICATOR (WOUND CARE) ×3 IMPLANT
ELECT REM PT RETURN 9FT ADLT (ELECTROSURGICAL) ×3
ELECTRODE REM PT RTRN 9FT ADLT (ELECTROSURGICAL) ×1 IMPLANT
EVACUATOR 1/8 PVC DRAIN (DRAIN) IMPLANT
FLOSEAL 5ML (HEMOSTASIS) ×3 IMPLANT
GAUZE 4X4 16PLY RFD (DISPOSABLE) IMPLANT
GAUZE SPONGE 4X4 12PLY STRL (GAUZE/BANDAGES/DRESSINGS) ×3 IMPLANT
GLOVE BIO SURGEON STRL SZ8 (GLOVE) ×6 IMPLANT
GLOVE BIOGEL PI IND STRL 8 (GLOVE) ×2 IMPLANT
GLOVE BIOGEL PI IND STRL 8.5 (GLOVE) ×2 IMPLANT
GLOVE BIOGEL PI INDICATOR 8 (GLOVE) ×4
GLOVE BIOGEL PI INDICATOR 8.5 (GLOVE) ×4
GLOVE ECLIPSE 7.0 STRL STRAW (GLOVE) ×3 IMPLANT
GLOVE ECLIPSE 8.0 STRL XLNG CF (GLOVE) ×6 IMPLANT
GLOVE EXAM NITRILE LRG STRL (GLOVE) IMPLANT
GLOVE EXAM NITRILE XL STR (GLOVE) IMPLANT
GLOVE EXAM NITRILE XS STR PU (GLOVE) IMPLANT
GLOVE SURG SS PI 7.0 STRL IVOR (GLOVE) ×9 IMPLANT
GLOVE SURG SS PI 7.5 STRL IVOR (GLOVE) ×9 IMPLANT
GOWN STRL REUS W/ TWL LRG LVL3 (GOWN DISPOSABLE) IMPLANT
GOWN STRL REUS W/ TWL XL LVL3 (GOWN DISPOSABLE) ×2 IMPLANT
GOWN STRL REUS W/TWL 2XL LVL3 (GOWN DISPOSABLE) ×6 IMPLANT
GOWN STRL REUS W/TWL LRG LVL3 (GOWN DISPOSABLE)
GOWN STRL REUS W/TWL XL LVL3 (GOWN DISPOSABLE) ×4
HEMOSTAT POWDER KIT SURGIFOAM (HEMOSTASIS) IMPLANT
KIT BASIN OR (CUSTOM PROCEDURE TRAY) ×3 IMPLANT
KIT INFUSE X SMALL 1.4CC (Orthopedic Implant) ×3 IMPLANT
KIT POSITION SURG JACKSON T1 (MISCELLANEOUS) ×3 IMPLANT
KIT TURNOVER KIT B (KITS) ×3 IMPLANT
MILL MEDIUM DISP (BLADE) ×3 IMPLANT
NEEDLE HYPO 25X1 1.5 SAFETY (NEEDLE) ×3 IMPLANT
NEEDLE SPNL 18GX3.5 QUINCKE PK (NEEDLE) IMPLANT
NS IRRIG 1000ML POUR BTL (IV SOLUTION) ×3 IMPLANT
OIL CARTRIDGE MAESTRO DRILL (MISCELLANEOUS) ×3
PACK LAMINECTOMY NEURO (CUSTOM PROCEDURE TRAY) ×3 IMPLANT
PAD ARMBOARD 7.5X6 YLW CONV (MISCELLANEOUS) ×9 IMPLANT
PATTIES SURGICAL .5 X.5 (GAUZE/BANDAGES/DRESSINGS) IMPLANT
PATTIES SURGICAL .5 X1 (DISPOSABLE) IMPLANT
PATTIES SURGICAL 1X1 (DISPOSABLE) IMPLANT
ROD RELINE LORDOTIC 5.5X45 (Rod) ×3 IMPLANT
ROD RELINE O-H CON M 5.0/6.0MM (Rod) ×6 IMPLANT
ROD RELINE-O 5.5X75 LORD (Rod) ×3 IMPLANT
SCREW LOCK RELINE 5.5 TULIP (Screw) ×12 IMPLANT
SCREW RELINE-O POLY 7.5X45 (Screw) ×6 IMPLANT
SPONGE LAP 4X18 RFD (DISPOSABLE) IMPLANT
SPONGE SURGIFOAM ABS GEL 100 (HEMOSTASIS) IMPLANT
STAPLER SKIN PROX WIDE 3.9 (STAPLE) IMPLANT
SUT VIC AB 1 CT1 18XBRD ANBCTR (SUTURE) ×2 IMPLANT
SUT VIC AB 1 CT1 8-18 (SUTURE) ×4
SUT VIC AB 2-0 CT1 18 (SUTURE) ×6 IMPLANT
SUT VIC AB 3-0 SH 8-18 (SUTURE) ×6 IMPLANT
SYR 5ML LL (SYRINGE) IMPLANT
TOWEL GREEN STERILE (TOWEL DISPOSABLE) ×3 IMPLANT
TOWEL GREEN STERILE FF (TOWEL DISPOSABLE) ×3 IMPLANT
TRAY FOLEY MTR SLVR 16FR STAT (SET/KITS/TRAYS/PACK) ×3 IMPLANT
WATER STERILE IRR 1000ML POUR (IV SOLUTION) ×3 IMPLANT

## 2017-08-17 NOTE — Progress Notes (Signed)
Physical Therapy Evaluation Patient Details Name: Robyn Smith MRN: [id number] DOB: 09/11/1955 Today's Date: 08/17/2017   History of Present Illness  Pt is a 62 y.o. female s/p PLIF L5-S1.  PMH includes PVD, mild fibromyalgia, anxiety, anemia.  Clinical Impression  Patient is s/p above surgery resulting in functional limitations due to the deficits listed below (see PT Problem List). At time of evaluation pt performed transfers and ambulation with gross min guard with VC for hand placement and postural awareness. Pt was cued multiple times throughout session on maintenance of precautions with functional mobility. Pt educated on brace maintenance and proper wearing schedule. Recommend the use of RW at d/c with pt in agreement, as she reports feeling unsteady.Patient will benefit from skilled PT to increase their independence and safety with mobility to allow discharge to the venue listed below.      Follow Up Recommendations No PT follow up    Equipment Recommendations  None recommended by PT    Recommendations for Other Services       Precautions / Restrictions Precautions Precautions: Back Precaution Booklet Issued: Yes (comment) Precaution Comments: Reviewed precautions with pt and husband Required Braces or Orthoses: Spinal Brace Spinal Brace: Lumbar corset;Applied in sitting position Restrictions Weight Bearing Restrictions: No      Mobility  Bed Mobility Overal bed mobility: Needs Assistance Bed Mobility: Rolling;Sidelying to Sit Rolling: Supervision Sidelying to sit: Min guard       General bed mobility comments: Lowered HOB to simulate bed at home; VCs for log roll. Donned lumbar corset in sitting with assistance with min cues for proper adjustment and fit.     Transfers Overall transfer level: Needs assistance Equipment used: Rolling walker (2 wheeled) Transfers: Sit to/from Stand Sit to Stand: Min guard         General transfer comment: VCs for hand  placement to push from bed instead of pull to stand with RW. Increased time needed.  Ambulation/Gait Ambulation/Gait assistance: Min guard Gait Distance (Feet): 50 Feet Assistive device: Rolling walker (2 wheeled) Gait Pattern/deviations: Step-through pattern Gait velocity: decreased Gait velocity interpretation: <1.31 ft/sec, indicative of household ambulator General Gait Details: VCs to improve posture. Initially guarded and anxious about ambulation secondary to pain   Stairs Stairs: Yes Stairs assistance: Min guard Stair Management: One rail Right;One rail Left;Step to pattern;Forwards;With walker Number of Stairs: 6 General stair comments: min cues for step-to pattern and safety  Wheelchair Mobility    Modified Rankin (Stroke Patients Only)       Balance Overall balance assessment: No apparent balance deficits (not formally assessed)                                           Pertinent Vitals/Pain Pain Assessment: 0-10 Pain Score: 7  Pain Location: Back Pain Descriptors / Indicators: Grimacing;Discomfort;Operative site guarding Pain Intervention(s): Limited activity within patient's tolerance;Monitored during session    Home Living Family/patient expects to be discharged to:: Private residence Living Arrangements: Spouse/significant other Available Help at Discharge: Family;Available 24 hours/day Type of Home: House Home Access: Stairs to enter Entrance Stairs-Rails: Doctor, general practice of Steps: 6 Home Layout: One level;Full bath on main level Home Equipment: Walker - 2 wheels;Cane - single point;Bedside commode Additional Comments: Pt has two entrances into house one with rails and one without. Feels safer negotiating stairs with rails at this time.     Prior  Function Level of Independence: Independent               Hand Dominance   Dominant Hand: Right    Extremity/Trunk Assessment   Upper Extremity  Assessment Upper Extremity Assessment: Defer to OT evaluation    Lower Extremity Assessment Lower Extremity Assessment: Overall WFL for tasks assessed    Cervical / Trunk Assessment Cervical / Trunk Assessment: Other exceptions Cervical / Trunk Exceptions: PLIF surgery  Communication   Communication: No difficulties  Cognition Arousal/Alertness: Awake/alert Behavior During Therapy: WFL for tasks assessed/performed Overall Cognitive Status: Within Functional Limits for tasks assessed                                        General Comments General comments (skin integrity, edema, etc.): Husband present at time of session    Exercises     Assessment/Plan    PT Assessment Patient needs continued PT services  PT Problem List Decreased strength;Decreased range of motion;Decreased activity tolerance;Decreased balance;Decreased knowledge of use of DME;Decreased knowledge of precautions;Pain       PT Treatment Interventions DME instruction;Gait training;Stair training;Functional mobility training;Therapeutic activities;Therapeutic exercise;Balance training;Patient/family education    PT Goals (Current goals can be found in the Care Plan section)  Acute Rehab PT Goals Patient Stated Goal: "to go home" PT Goal Formulation: With patient Time For Goal Achievement: 08/24/17 Potential to Achieve Goals: Good    Frequency Min 5X/week   Barriers to discharge        Co-evaluation               AM-PAC PT "6 Clicks" Daily Activity  Outcome Measure Difficulty turning over in bed (including adjusting bedclothes, sheets and blankets)?: A Little Difficulty moving from lying on back to sitting on the side of the bed? : A Little Difficulty sitting down on and standing up from a chair with arms (e.g., wheelchair, bedside commode, etc,.)?: A Little Help needed moving to and from a bed to chair (including a wheelchair)?: A Little Help needed walking in hospital room?: A  Little Help needed climbing 3-5 steps with a railing? : A Little 6 Click Score: 18    End of Session Equipment Utilized During Treatment: Gait belt;Back brace Activity Tolerance: Patient tolerated treatment well Patient left: in chair;with call bell/phone within reach;with family/visitor present Nurse Communication: Mobility status PT Visit Diagnosis: Other abnormalities of gait and mobility (R26.89);Pain Pain - part of body: (back)    Time: [id number] PT Time Calculation (min) (ACUTE ONLY): 28 min   Charges: 1 MOD   1 gait training 8-22 min            Robyn Smith, [place]  Student Physical Therapist Acute Rehab [phone]   Robyn Smith 08/17/2017, 2:42 PM

## 2017-08-17 NOTE — Anesthesia Procedure Notes (Signed)
Procedure Name: Intubation Date/Time: 08/17/2017 8:38 AM Performed by: Clearnce Sorrel, CRNA Pre-anesthesia Checklist: Patient identified, Emergency Drugs available, Suction available, Patient being monitored and Timeout performed Patient Re-evaluated:Patient Re-evaluated prior to induction Oxygen Delivery Method: Circle system utilized Preoxygenation: Pre-oxygenation with 100% oxygen Induction Type: IV induction Ventilation: Mask ventilation without difficulty Laryngoscope Size: Mac and 4 Grade View: Grade I Tube type: Oral Tube size: 7.0 mm Number of attempts: 1 Airway Equipment and Method: Stylet Placement Confirmation: ETT inserted through vocal cords under direct vision,  positive ETCO2 and breath sounds checked- equal and bilateral Secured at: 23 cm Tube secured with: Tape Dental Injury: Teeth and Oropharynx as per pre-operative assessment

## 2017-08-17 NOTE — Progress Notes (Signed)
Pharmacy Antibiotic Note  Robyn Smith is a 62 y.o. fKassie Smith admitted on 08/17/2017 for planned spinal surgery.  Pharmacy has been consulted for Vancomycin dosing post-op.   Vancomycin 1g given pre-op at 0645. No drain in place per RN.  Plan: - Vancomycin 1g IV x 1 dose at 1900 today - Pharmacy will sign off as no further doses expected at this time     Temp (24hrs), Avg:97.8 F (36.6 C), Min:97.3 F (36.3 C), Max:98.7 F (37.1 C)  Recent Labs  Lab 08/14/17 1330  WBC 7.9  CREATININE 0.81    Estimated Creatinine Clearance: 82.3 mL/min (by C-G formula based on SCr of 0.81 mg/dL).    Allergies  Allergen Reactions  . Penicillins Other (See Comments)    UNSPECIFIED CHILDHOOD REACTION Has patient had a PCN reaction causing immediate rash, facial/tongue/throat swelling, SOB or lightheadedness with hypotension: Unknown Has patient had a PCN reaction causing severe rash involving mucus membranes or skin necrosis: Unknown Has patient had a PCN reaction that required hospitalization: No Has patient had a PCN reaction occurring within the last 10 years: No Childhood reaction If all of the above answers are "NO", then may proceed with Cep    Thank you for allowing pharmacy to be a part of this patient's care.  Georgina PillionElizabeth Shamila Lerch, PharmD, BCPS Pager: [phone] 3:16 PM

## 2017-08-17 NOTE — Op Note (Signed)
08/17/2017  11:22 AM  PATIENT:  Robyn Smith  62 y.o. female  PRE-OPERATIVE DIAGNOSIS:  Spondylolisthesis, Lumbar region with herniated lumbar disc, stenosis, lumbago, scoliosis, radiculopathy  POST-OPERATIVE DIAGNOSIS:  Spondylolisthesis, Lumbar region with herniated lumbar disc, stenosis, lumbago, scoliosis, radiculopathy with spodylolysis L 5   PROCEDURE:  Procedure(s): Lumbar Five Sacral One Decompression/Fusion (N/A) L 5 Gill, PLIF L5 S 1 , posterolateral arthrodesis, exploration of fusion  SURGEON:  Surgeon(s) and Role:    * Tyreque Finken, MD - Primary    * Nundkumar, Neelesh, MD - Assisting  PHYSICIAN ASSISTANT:   ASSISTANTS: Poteat, RN   ANESTHESIA:   general  EBL:  150 mL   BLOOD ADMINISTERED:none  DRAINS: none   LOCAL MEDICATIONS USED:  MARCAINE    and LIDOCAINE   SPECIMEN:  No Specimen  DISPOSITION OF SPECIMEN:  N/A  COUNTS:  YES  TOURNIQUET:  * No tourniquets in log *   DICTATION: Patient is 62-year-old woman with prior fusion at L 1 - 5 with lumbar scoliosis and spondylolisthesis of L 5 S 1  level with scoliosis affecting left greater than left leg. It was elected to take her to surgery for exploration of prior fusion with decompression and fusion at the L 5 S 1 level.   Procedure: Patient was placed in a prone position on the Jackson table after smooth and uncomplicated induction of general endotracheal anesthesia. Her low back was prepped and draped in usual sterile fashion with betadine scrub and DuraPrep. Area of incision was infiltrated with local lidocaine. Incision was made to the lumbodorsal fascia was incised and exposure was performed of the previously placed hardware, which was removed.  There did not appear to be any loosening of screws and after careful exploration, there appeared to be good bridging bone growth with no mobility at the previously operated levels. Intraoperative x-ray was obtained which confirmed correct orientation with marker probes  at the S 1 pedicle.  Connectors were placed on rods between L 4 and L 5 screws on the left and L 3 and L 4 screws on the right . There was spondylolysis of L 5 with mobile posterior elements.  A Gill procedure of L 5 was performed with disarticulation of the facet joints at this level and thorough decompression was performed of both L5 and S 1 nerve roots along with the common dural tube. A thorough discectomy was initially performed on the left with preparation of the endplates for grafting a trial spacer was placed this level and a thorough discectomy was performed on the right as well. Bone autograft was packed within the interspace  along with  Extra small BMP kit. A 8 x 9 x 23 mm x 15 degree PEEK PLIF cage was  packed with autograft and was inserted the interspace and countersunk appropriately initially on the right.  10 cc autograft and small amount of BMP was packed medial to the first cage and a second cage of similar size was placed on the left and countersunk appropriately.  The posterolateral region was extensively decorticated and pedicle probes were placed at S 1 and 7.5 x 45 mm screws were placed at S 1 bilaterally. Intraoperative fluoroscopy confirmed correct orientationin the AP and lateral plane.  Final x-rays demonstrated well-positioned interbody graft and pedicle screw fixation. A  45 mm lordotic rod was placed on the left and a 75 mm rod was placed on the right locked down in situ and the posterolateral region was packed with the remaining   BMP and bone 20 cc autograft/allograft on the right and 10 cc on the left.  Long-acting Marcaine was injected in the deep musculature.  The wound was irrigated.  Fascia was closed with 1 Vicryl sutures skin edges were reapproximated 2 and 3-0 Vicryl sutures. The wound is dressed with Dermabond and an occlusive dressing.   the patient was extubated in the operating room and taken to recovery in stable satisfactory condition. Counts were correct at the end of  the case.    PLAN OF CARE: Admit to inpatient   PATIENT DISPOSITION:  PACU - hemodynamically stable.   Delay start of Pharmacological VTE agent (>24hrs) due to surgical blood loss or risk of bleeding: yes  

## 2017-08-17 NOTE — Progress Notes (Signed)
Patient ID: Robyn MendsLinda T Moure, female   DOB: Mar 11, 1955, 62 y.o.   MRN: [id number] States, "I wasn't expecting to feel this good". Alert, conversant. Has walked with therapist. Good strength BLE. Pain well-controlled with po meds.

## 2017-08-17 NOTE — Anesthesia Postprocedure Evaluation (Signed)
Anesthesia Post Note  Patient: Robyn Smith  Procedure(s) Performed: Lumbar Five Sacral One Decompression/Fusion (N/A Back)     Patient location during evaluation: PACU Anesthesia Type: General Level of consciousness: awake and alert Pain management: pain level controlled Vital Signs Assessment: post-procedure vital signs reviewed and stable Respiratory status: spontaneous breathing, nonlabored ventilation and respiratory function stable Cardiovascular status: blood pressure returned to baseline and stable Postop Assessment: no apparent nausea or vomiting Anesthetic complications: no    Last Vitals:  Vitals:   08/17/17 1210 08/17/17 1220  BP: 117/66 129/80  Pulse: (!) 58 63  Resp: 16 14  Temp:    SpO2: 100% 96%    Last Pain:  Vitals:   08/17/17 1220  TempSrc:   PainSc: 8                  Kaytee Taliercio,W. EDMOND

## 2017-08-17 NOTE — Transfer of Care (Signed)
Immediate Anesthesia Transfer of Care Note  Patient: Robyn Smith  Procedure(s) Performed: Lumbar Five Sacral One Decompression/Fusion (N/A Back)  Patient Location: PACU  Anesthesia Type:General  Level of Consciousness: awake and oriented  Airway & Oxygen Therapy: Patient Spontanous Breathing and Patient connected to nasal cannula oxygen  Post-op Assessment: Report given to RN and Post -op Vital signs reviewed and stable  Post vital signs: Reviewed and stable  Last Vitals:  Vitals Value Taken Time  BP 120/62 08/17/2017 11:36 AM  Temp    Pulse 65 08/17/2017 11:37 AM  Resp 2 08/17/2017 11:37 AM  SpO2 100 % 08/17/2017 11:37 AM  Vitals shown include unvalidated device data.  Last Pain:  Vitals:   08/17/17 0616  TempSrc: Oral  PainSc:          Complications: No apparent anesthesia complications

## 2017-08-17 NOTE — Brief Op Note (Signed)
08/17/2017  11:22 AM  PATIENT:  Robyn Smith  62 y.o. female  PRE-OPERATIVE DIAGNOSIS:  Spondylolisthesis, Lumbar region with herniated lumbar disc, stenosis, lumbago, scoliosis, radiculopathy  POST-OPERATIVE DIAGNOSIS:  Spondylolisthesis, Lumbar region with herniated lumbar disc, stenosis, lumbago, scoliosis, radiculopathy with spodylolysis L 5   PROCEDURE:  Procedure(s): Lumbar Five Sacral One Decompression/Fusion (N/A) L 5 Gill, PLIF L5 S 1 , posterolateral arthrodesis, exploration of fusion  SURGEON:  Surgeon(s) and Role:    Erline Levine, MD - Primary    Consuella Lose, MD - Assisting  PHYSICIAN ASSISTANT:   ASSISTANTS: Poteat, RN   ANESTHESIA:   general  EBL:  150 mL   BLOOD ADMINISTERED:none  DRAINS: none   LOCAL MEDICATIONS USED:  MARCAINE    and LIDOCAINE   SPECIMEN:  No Specimen  DISPOSITION OF SPECIMEN:  N/A  COUNTS:  YES  TOURNIQUET:  * No tourniquets in log *   DICTATION: Patient is 62 year old woman with prior fusion at L 1 - 5 with lumbar scoliosis and spondylolisthesis of L 5 S 1  level with scoliosis affecting left greater than left leg. It was elected to take her to surgery for exploration of prior fusion with decompression and fusion at the L 5 S 1 level.   Procedure: Patient was placed in a prone position on the Dublin table after smooth and uncomplicated induction of general endotracheal anesthesia. Her low back was prepped and draped in usual sterile fashion with betadine scrub and DuraPrep. Area of incision was infiltrated with local lidocaine. Incision was made to the lumbodorsal fascia was incised and exposure was performed of the previously placed hardware, which was removed.  There did not appear to be any loosening of screws and after careful exploration, there appeared to be good bridging bone growth with no mobility at the previously operated levels. Intraoperative x-ray was obtained which confirmed correct orientation with marker probes  at the S 1 pedicle.  Connectors were placed on rods between L 4 and L 5 screws on the left and L 3 and L 4 screws on the right . There was spondylolysis of L 5 with mobile posterior elements.  A Gill procedure of L 5 was performed with disarticulation of the facet joints at this level and thorough decompression was performed of both L5 and S 1 nerve roots along with the common dural tube. A thorough discectomy was initially performed on the left with preparation of the endplates for grafting a trial spacer was placed this level and a thorough discectomy was performed on the right as well. Bone autograft was packed within the interspace  along with  Extra small BMP kit. A 8 x 9 x 23 mm x 15 degree PEEK PLIF cage was  packed with autograft and was inserted the interspace and countersunk appropriately initially on the right.  10 cc autograft and small amount of BMP was packed medial to the first cage and a second cage of similar size was placed on the left and countersunk appropriately.  The posterolateral region was extensively decorticated and pedicle probes were placed at S 1 and 7.5 x 45 mm screws were placed at S 1 bilaterally. Intraoperative fluoroscopy confirmed correct orientationin the AP and lateral plane.  Final x-rays demonstrated well-positioned interbody graft and pedicle screw fixation. A  45 mm lordotic rod was placed on the left and a 75 mm rod was placed on the right locked down in situ and the posterolateral region was packed with the remaining  BMP and bone 20 cc autograft/allograft on the right and 10 cc on the left.  Long-acting Marcaine was injected in the deep musculature.  The wound was irrigated.  Fascia was closed with 1 Vicryl sutures skin edges were reapproximated 2 and 3-0 Vicryl sutures. The wound is dressed with Dermabond and an occlusive dressing.   the patient was extubated in the operating room and taken to recovery in stable satisfactory condition. Counts were correct at the end of  the case.    PLAN OF CARE: Admit to inpatient   PATIENT DISPOSITION:  PACU - hemodynamically stable.   Delay start of Pharmacological VTE agent (>24hrs) due to surgical blood loss or risk of bleeding: yes

## 2017-08-17 NOTE — Evaluation (Signed)
Occupational Therapy Evaluation and Discharge Patient Details Name: Robyn Smith MRN: [id number] DOB: 27-Apr-1955 Today's Date: 08/17/2017    History of Present Illness Pt is a 62 y.o. female s/p PLIF L5-S1.   Clinical Impression   This 62 yo female admitted and underwent above presents to acute OT with all education completed, we will D/C from acute OT.    Follow Up Recommendations  No OT follow up;Supervision/Assistance - 24 hour    Equipment Recommendations  None recommended by OT       Precautions / Restrictions Precautions Precautions: Back Precaution Booklet Issued: Yes (comment) Precaution Comments: Reviewed precautions with pt and husband Required Braces or Orthoses: Spinal Brace Spinal Brace: Lumbar corset;Applied in sitting position Restrictions Weight Bearing Restrictions: No             ADL either performed or assessed with clinical judgement   ADL                                         General ADL Comments: Educated pt and husband (handout also given) on use of wet wipes for back peri care and leaning instead of twisting, use of 2 cups for brushing teeth to avoid bending over sink, limit sitting to no more than 20-30 minutes at a time then get up and walk so can sit another 20-30 minutes, sequence of dressing, stance for sit<>stand that will make it easier if surface is lower or not arms to reach back for/push up from      Vision Patient Visual Report: No change from baseline              Pertinent Vitals/Pain Pain Assessment: 0-10 Pain Score: 6  Pain Location: Back Pain Descriptors / Indicators: Operative site guarding;Sore Pain Intervention(s): Limited activity within patient's tolerance;Monitored during session     Hand Dominance Right   Extremity/Trunk Assessment Upper Extremity Assessment Upper Extremity Assessment: Overall WFL for tasks assessed     Communication Communication Communication: No difficulties    Cognition Arousal/Alertness: Awake/alert Behavior During Therapy: WFL for tasks assessed/performed Overall Cognitive Status: Within Functional Limits for tasks assessed                                     General Comments  Husband present at time of session            Home Living Family/patient expects to be discharged to:: Private residence Living Arrangements: Spouse/significant other Available Help at Discharge: Family;Available 24 hours/day Type of Home: House Home Access: Stairs to enter [employer] of Steps: 6 Entrance Stairs-Rails: Right;Left Home Layout: One level;Full bath on main level     Bathroom Shower/Tub: Producer, television/film/video: Handicapped height     Home Equipment: Environmental consultant - 2 wheels;Cane - single point;Bedside commode;Shower seat - built in;Hand held shower head;Grab bars - tub/shower   Additional Comments: Pt has two entrances into house one with rails and one without. Feels safer negotiating stairs with rails at this time.       Prior Functioning/Environment Level of Independence: Independent                 OT Problem List: Decreased range of motion;Pain         OT Goals(Current goals can be found in the care plan section)  Acute Rehab OT Goals Patient Stated Goal: "to go home"  OT Frequency:                AM-PAC PT "6 Clicks" Daily Activity     Outcome Measure Help from another person eating meals?: None Help from another person taking care of personal grooming?: A Little Help from another person toileting, which includes using toliet, bedpan, or urinal?: A Little Help from another person bathing (including washing, rinsing, drying)?: A Little Help from another person to put on and taking off regular upper body clothing?: A Little Help from another person to put on and taking off regular lower body clothing?: A Little 6 Click Score: 19   End of Session    Activity Tolerance: Patient tolerated  treatment well Patient left: in chair;with call bell/phone within reach;with family/visitor present  OT Visit Diagnosis: Pain Pain - part of body: (back)                Time: 5621-30861425-1439 OT Time Calculation (min): 14 min Charges:  OT General Charges $OT Visit: 1 Visit OT Evaluation $OT Eval Moderate Complexity: [address] Mod  Ignacia PalmaCathy Latrell [place], [place] [phone] 08/17/2017

## 2017-08-18 MED ORDER — METHOCARBAMOL 500 MG PO TABS: 500.0000 mg | ORAL_TABLET | Freq: Four times a day (QID) | ORAL | 2 refills | Status: DC | PRN

## 2017-08-18 MED ORDER — OXYCODONE-ACETAMINOPHEN 10-325 MG PO TABS: 1.0000 | ORAL_TABLET | Freq: Four times a day (QID) | ORAL | 0 refills | Status: DC | PRN

## 2017-08-18 NOTE — Progress Notes (Signed)
Patient alert and oriented, mae's well, voiding adequate amount of urine, swallowing without difficulty, no c/o pain at time of discharge, only soreness and medication given. Patient discharged home with family. Script and discharged instructions given to patient. Patient and family stated understanding of instructions given. Patient has an appointment with Dr.Stern

## 2017-08-18 NOTE — Progress Notes (Signed)
Physical Therapy Treatment Patient Details Name: Robyn Smith MRN: [id number] DOB: 07-May-1955 Today's Date: 08/18/2017    History of Present Illness Pt is a 62 y.o. female s/p PLIF L5-S1.    PT Comments    Patient seen for mobility progression s/p spinal surgery. Mobilizing well. Educated patient on precautions, mobility expectations, safety and car transfers. No further acute PT needs. Will sign off.    Follow Up Recommendations  No PT follow up     Equipment Recommendations  None recommended by PT    Recommendations for Other Services       Precautions / Restrictions Precautions Precautions: Back Precaution Booklet Issued: Yes (comment) Precaution Comments: Reviewed precautions with pt and husband Required Braces or Orthoses: Spinal Brace Spinal Brace: Lumbar corset;Applied in sitting position Restrictions Weight Bearing Restrictions: No    Mobility  Bed Mobility Overal bed mobility: Modified Independent Bed Mobility: Rolling;Sidelying to Sit              Transfers Overall transfer level: Modified independent Equipment used: Rolling walker (2 wheeled) Transfers: Sit to/from Stand              Ambulation/Gait Ambulation/Gait assistance: Modified independent (Device/Increase time) Gait Distance (Feet): 410 Feet Assistive device: Rolling walker (2 wheeled) Gait Pattern/deviations: Step-through pattern   Gait velocity interpretation: 1.31 - 2.62 ft/sec, indicative of limited community ambulator General Gait Details: steady with ambulation   Stairs             Wheelchair Mobility    Modified Rankin (Stroke Patients Only)       Balance Overall balance assessment: No apparent balance deficits (not formally assessed)                                          Cognition Arousal/Alertness: Awake/alert Behavior During Therapy: WFL for tasks assessed/performed Overall Cognitive Status: Within Functional Limits for tasks  assessed                                        Exercises      General Comments        Pertinent Vitals/Pain Pain Assessment: 0-10 Pain Score: 4  Pain Location: Back Pain Descriptors / Indicators: Operative site guarding;Sore Pain Intervention(s): Monitored during session    Home Living                      Prior Function            PT Goals (current goals can now be found in the care plan section) Acute Rehab PT Goals Patient Stated Goal: "to go home" PT Goal Formulation: With patient Time For Goal Achievement: 08/24/17 Potential to Achieve Goals: Good Progress towards PT goals: Goals met/education completed, patient discharged from PT    Frequency    Min 5X/week      PT Plan Current plan remains appropriate    Co-evaluation              AM-PAC PT "6 Clicks" Daily Activity  Outcome Measure  Difficulty turning over in bed (including adjusting bedclothes, sheets and blankets)?: None Difficulty moving from lying on back to sitting on the side of the bed? : None Difficulty sitting down on and standing up from a chair with arms (e.g., wheelchair, bedside  commode, etc,.)?: A Little Help needed moving to and from a bed to chair (including a wheelchair)?: A Little Help needed walking in hospital room?: A Little Help needed climbing 3-5 steps with a railing? : A Little 6 Click Score: 20    End of Session Equipment Utilized During Treatment: Gait belt;Back brace Activity Tolerance: Patient tolerated treatment well Patient left: in bed;with call bell/phone within reach(sitting EOB eating breakfast) Nurse Communication: Mobility status PT Visit Diagnosis: Other abnormalities of gait and mobility (R26.89);Pain Pain - part of body: (back)     Time: 0712-0729 PT Time Calculation (min) (ACUTE ONLY): 17 min  Charges:  $Gait Training: 8-22 mins                     Alben Deeds, PT DPT  Board Certified Neurologic  Specialist [place] 08/18/2017, 7:31 AM

## 2017-08-18 NOTE — Discharge Summary (Signed)
Physician Discharge Summary  Patient ID: Robyn Smith MRN: [id number] DOB/AGE: 12-Jan-1955 62 y.o.  Admit date: 08/17/2017 Discharge date: 08/18/2017  Admission Diagnoses:  Lumbar spondylolisthesis  Discharge Diagnoses:  Same Active Problems:   Spondylolisthesis of lumbar region   Discharged Condition: Stable  Hospital Course:  Robyn Smith is a 62 y.o. female who was admitted for the below procedure. There were no post operative complications. At time of discharge, pain was well controlled, ambulating with Pt/OT, tolerating po, voiding normal. Ready for discharge.  Treatments: Surgery Lumbar Five Sacral One Decompression/Fusion (N/A) L 5 Gill, PLIF L5 S 1 , posterolateral arthrodesis, exploration of fusion  Discharge Exam: Blood pressure 102/66, pulse 79, temperature 98.5 F (36.9 C), temperature source Oral, resp. rate 18, SpO2 98 %. Awake, alert, oriented Speech fluent, appropriate CN grossly intact 5/5 BUE/BLE Wound c/d/i  Disposition: Discharge disposition: 01-Home or Self Care       Discharge Instructions    Call MD for:  difficulty breathing, headache or visual disturbances   Complete by:  As directed    Call MD for:  persistant dizziness or light-headedness   Complete by:  As directed    Call MD for:  redness, tenderness, or signs of infection (pain, swelling, redness, odor or green/yellow discharge around incision site)   Complete by:  As directed    Call MD for:  severe uncontrolled pain   Complete by:  As directed    Call MD for:  temperature >100.4   Complete by:  As directed    Diet general   Complete by:  As directed    Driving Restrictions   Complete by:  As directed    Do not drive until given clearance.   Increase activity slowly   Complete by:  As directed    Lifting restrictions   Complete by:  As directed    Do not lift anything >10lbs. Avoid bending and twisting in awkward positions. Avoid bending at the back.   May shower / Bathe    Complete by:  As directed    In 24 hours. Okay to wash wound with warm soapy water. Avoid scrubbing the wound. Pat dry.   Remove dressing in 24 hours   Complete by:  As directed      Allergies as of 08/18/2017      Reactions   Penicillins Other (See Comments)   UNSPECIFIED CHILDHOOD REACTION Has patient had a PCN reaction causing immediate rash, facial/tongue/throat swelling, SOB or lightheadedness with hypotension: Unknown Has patient had a PCN reaction causing severe rash involving mucus membranes or skin necrosis: Unknown Has patient had a PCN reaction that required hospitalization: No Has patient had a PCN reaction occurring within the last 10 years: No Childhood reaction If all of the above answers are "NO", then may proceed with Cep      Medication List    TAKE these medications   lidocaine 2 % solution Commonly known as:  XYLOCAINE Use as directed 5 mLs in the mouth or throat every 6 (six) hours as needed for mouth pain.   methocarbamol 500 MG tablet Commonly known as:  ROBAXIN Take 1 tablet (500 mg total) by mouth every 6 (six) hours as needed for muscle spasms.   oxyCODONE-acetaminophen 10-325 MG tablet Commonly known as:  PERCOCET Take 1 tablet by mouth every 6 (six) hours as needed for pain.      Follow-up Information    Maeola HarmanStern, Joseph, MD Follow up.   Specialty:  Neurosurgery  Contact information: [address] [place] [address] [phone]           Signed: Alyson Ingles 08/18/2017, 8:00 AM

## 2018-07-10 HISTORY — PX: VASCULAR SURGERY: SHX849

## 2018-11-14 NOTE — Patient Instructions (Addendum)
DUE TO COVID-19 ONLY ONE VISITOR IS ALLOWED TO COME WITH YOU AND STAY IN THE WAITING ROOM ONLY DURING PRE OP AND PROCEDURE DAY OF SURGERY. THE 1 VISITOR MAY VISIT WITH YOU AFTER SURGERY IN YOUR PRIVATE ROOM DURING VISITING HOURS ONLY!  YOU NEED TO HAVE A COVID 19 TEST ON_11/6______ @_11 :35______, THIS TEST MUST BE DONE BEFORE SURGERY, COME  [address], [place] , .  [facility]) ONCE YOUR COVID TEST IS COMPLETED, PLEASE BEGIN THE QUARANTINE INSTRUCTIONS AS OUTLINED IN YOUR HANDOUT.                Robyn Smith   Your procedure is scheduled on: 11/19/18   Report to [facility] Main  Entrance   Report to admitting at  10:20 AM     Call this number if you have problems the morning of surgery [phone]   . BRUSH YOUR TEETH MORNING OF SURGERY AND RINSE YOUR MOUTH OUT, NO CHEWING GUM CANDY OR MINTS.   Do not eat food After Midnight.   YOU MAY HAVE CLEAR LIQUIDS FROM MIDNIGHT UNTIL 9:30 AM.    CLEAR LIQUID DIET   Foods Allowed                                                                     Foods Excluded  Coffee and tea, regular and decaf                             liquids that you cannot  Plain Jell-O any favor except red or purple                                           see through such as: Fruit ices (not with fruit pulp)                                     milk, soups, orange juice  Iced Popsicles                                    All solid food Carbonated beverages, regular and diet                                    Cranberry, grape and apple juices Sports drinks like Gatorade Lightly seasoned clear broth or consume(fat free) Sugar, honey syrup   At 9:30 AM Please finish the prescribed Pre-Surgery  Drink.   Nothing by mouth after you finish the Gatorade drink !    Take these medicines the morning of surgery with A SIP OF WATER: none                                 You may not have any metal on your body including  hair pins and  piercings             Do not wear jewelry, make-up, lotions, powders or perfumes, deodorant             Do not wear nail polish on your fingernails.  Do not shave  48 hours prior to surgery.           Do not bring valuables to the hospital. [place].  Contacts, dentures or bridgework may not be worn into surgery.        Special Instructions: N/A              Please read over the following fact sheets you were given: _____________________________________________________________________             [facility] - Preparing for Surgery  Before surgery, you can play an important role.   Because skin is not sterile, your skin needs to be as free of germs as possible.   You can reduce the number of germs on your skin by washing with CHG (chlorahexidine gluconate) soap before surgery.   CHG is an antiseptic cleaner which kills germs and bonds with the skin to continue killing germs even after washing. Please DO NOT use if you have an allergy to CHG or antibacterial soaps.   If your skin becomes reddened/irritated stop using the CHG and inform your nurse when you arrive at Short Stay. Do not shave (including legs and underarms) for at least 48 hours prior to the first CHG shower.    Please follow these instructions carefully:  1.  Shower with CHG Soap the night before surgery and the  morning of Surgery.  2.  If you choose to wash your hair, wash your hair first as usual with your  normal  shampoo.  3.  After you shampoo, rinse your hair and body thoroughly to remove the  shampoo.                                        4.  Use CHG as you would any other liquid soap.  You can apply chg directly  to the skin and wash                       Gently with a scrungie or clean washcloth.  5.  Apply the CHG Soap to your body ONLY FROM THE NECK DOWN.   Do not use on face/ open                           Wound or open sores.  Avoid contact with eyes, ears mouth and genitals (private parts).                       Wash face,  Genitals (private parts) with your normal soap.             6.  Wash thoroughly, paying special attention to the area where your surgery  will be performed.  7.  Thoroughly rinse your body with warm water from the neck down.  8.  DO NOT shower/wash with your normal soap after using and rinsing off  the CHG Soap.  9.  Pat yourself dry with a clean towel.            10.  Wear clean pajamas.            11.  Place clean sheets on your bed the night of your first shower and do not  sleep with pets. Day of Surgery : Do not apply any lotions/deodorants the morning of surgery.  Please wear clean clothes to the hospital/surgery center.  FAILURE TO FOLLOW THESE INSTRUCTIONS MAY RESULT IN THE CANCELLATION OF YOUR SURGERY PATIENT SIGNATURE_________________________________  NURSE SIGNATURE__________________________________  ________________________________________________________________________   Adam Phenix  An incentive spirometer is a tool that can help keep your lungs clear and active. This tool measures how well you are filling your lungs with each breath. Taking long deep breaths may help reverse or decrease the chance of developing breathing (pulmonary) problems (especially infection) following:  A long period of time when you are unable to move or be active. BEFORE THE PROCEDURE   If the spirometer includes an indicator to show your best effort, your nurse or respiratory therapist will set it to a desired goal.  If possible, sit up straight or lean slightly forward. Try not to slouch.  Hold the incentive spirometer in an upright position. INSTRUCTIONS FOR USE  1. Sit on the edge of your bed if possible, or sit up as far as you can in bed or on a chair. 2. Hold the incentive spirometer in an upright position. 3. Breathe out normally. 4. Place the mouthpiece in your  mouth and seal your lips tightly around it. 5. Breathe in slowly and as deeply as possible, raising the piston or the ball toward the top of the column. 6. Hold your breath for 3-5 seconds or for as long as possible. Allow the piston or ball to fall to the bottom of the column. 7. Remove the mouthpiece from your mouth and breathe out normally. 8. Rest for a few seconds and repeat Steps 1 through 7 at least 10 times every 1-2 hours when you are awake. Take your time and take a few normal breaths between deep breaths. 9. The spirometer may include an indicator to show your best effort. Use the indicator as a goal to work toward during each repetition. 10. After each set of 10 deep breaths, practice coughing to be sure your lungs are clear. If you have an incision (the cut made at the time of surgery), support your incision when coughing by placing a pillow or rolled up towels firmly against it. Once you are able to get out of bed, walk around indoors and cough well. You may stop using the incentive spirometer when instructed by your caregiver.  RISKS AND COMPLICATIONS  Take your time so you do not get dizzy or light-headed.  If you are in pain, you may need to take or ask for pain medication before doing incentive spirometry. It is harder to take a deep breath if you are having pain. AFTER USE  Rest and breathe slowly and easily.  It can be helpful to keep track of a log of your progress. Your caregiver can provide you with a simple table to help with this. If you are using the spirometer at home, follow these instructions: [place] IF:   You are having difficultly using the spirometer.  You have trouble using the spirometer as often as instructed.  Your pain medication is not giving enough relief while using the spirometer.  You develop  fever of 100.5 F (38.1 C) or higher. SEEK IMMEDIATE MEDICAL CARE IF:   You cough up bloody sputum that had not been present before.  You  develop fever of 102 F (38.9 C) or greater.  You develop worsening pain at or near the incision site. MAKE SURE YOU:   Understand these instructions.  Will watch your condition.  Will get help right away if you are not doing well or get worse. Document Released: 05/08/2006 Document Revised: 03/20/2011 Document Reviewed: 07/09/2006 Exit[employer] Patient Information 2014 [employer], [place].   ________________________________________________________________________  WHAT IS A BLOOD TRANSFUSION? Blood Transfusion Information  A transfusion is the replacement of blood or some of its parts. Blood is made up of multiple cells which provide different functions.  Red blood cells carry oxygen and are used for blood loss replacement.  White blood cells fight against infection.  Platelets control bleeding.  Plasma helps clot blood.  Other blood products are available for specialized needs, such as hemophilia or other clotting disorders. BEFORE THE TRANSFUSION  Who gives blood for transfusions?   Healthy volunteers who are fully evaluated to make sure their blood is safe. This is blood bank blood. Transfusion therapy is the safest it has ever been in the practice of medicine. Before blood is taken from a donor, a complete history is taken to make sure that person has no history of diseases nor engages in risky social behavior (examples are intravenous drug use or sexual activity with multiple partners). The donor's travel history is screened to minimize risk of transmitting infections, such as malaria. The donated blood is tested for signs of infectious diseases, such as HIV and hepatitis. The blood is then tested to be sure it is compatible with you in order to minimize the chance of a transfusion reaction. If you or a relative donates blood, this is often done in anticipation of surgery and is not appropriate for emergency situations. It takes many days to process the donated blood. RISKS AND  COMPLICATIONS Although transfusion therapy is very safe and saves many lives, the main dangers of transfusion include:   Getting an infectious disease.  Developing a transfusion reaction. This is an allergic reaction to something in the blood you were given. Every precaution is taken to prevent this. The decision to have a blood transfusion has been considered carefully by your caregiver before blood is given. Blood is not given unless the benefits outweigh the risks. AFTER THE TRANSFUSION  Right after receiving a blood transfusion, you will usually feel much better and more energetic. This is especially true if your red blood cells have gotten low (anemic). The transfusion raises the level of the red blood cells which carry oxygen, and this usually causes an energy increase.  The nurse administering the transfusion will monitor you carefully for complications. HOME CARE INSTRUCTIONS  No special instructions are needed after a transfusion. You may find your energy is better. Speak with your caregiver about any limitations on activity for underlying diseases you may have. SEEK MEDICAL CARE IF:   Your condition is not improving after your transfusion.  You develop redness or irritation at the intravenous (IV) site. SEEK IMMEDIATE MEDICAL CARE IF:  Any of the following symptoms occur over the next 12 hours:  Shaking chills.  You have a temperature by mouth above 102 F (38.9 C), not controlled by medicine.  Chest, back, or muscle pain.  People around you feel you are not acting correctly or are confused.  Shortness of breath  or difficulty breathing.  Dizziness and fainting.  You get a rash or develop hives.  You have a decrease in urine output.  Your urine turns a dark color or changes to pink, red, or brown. Any of the following symptoms occur over the next 10 days:  You have a temperature by mouth above 102 F (38.9 C), not controlled by medicine.  Shortness of  breath.  Weakness after normal activity.  The white part of the eye turns yellow (jaundice).  You have a decrease in the amount of urine or are urinating less often.  Your urine turns a dark color or changes to pink, red, or brown. Document Released: 12/24/1999 Document Revised: 03/20/2011 Document Reviewed: 08/12/2007 [facility] Patient Information 2014 [place], [place].  _______________________________________________________________________

## 2018-11-14 NOTE — H&P (Signed)
TOTAL HIP ADMISSION H&P  Patient is admitted for left total hip arthroplasty, anterior approach  Subjective:  Chief Complaint:   Left hip primary OA / pain  HPI: Robyn Smith, 63 y.o. female, has a history of pain and functional disability in the left hip(s) due to arthritis and patient has failed non-surgical conservative treatments for greater than 12 weeks to include NSAID's and/or analgesics and activity modification.  Onset of symptoms was gradual starting 1 years ago with gradually worsening course since that time.The patient noted prior procedures of the hip to include arthroplasty on the right hip(s).  Patient currently rates pain in the left hip at 9 out of 10 with activity. Patient has night pain, worsening of pain with activity and weight bearing, trendelenberg gait, pain that interfers with activities of daily living and pain with passive range of motion. Patient has evidence of periarticular osteophytes and joint space narrowing by imaging studies. This condition presents safety issues increasing the risk of falls. There is no current active infection.  Risks, benefits and expectations were discussed with the patient.  Risks including but not limited to the risk of anesthesia, blood clots, nerve damage, blood vessel damage, failure of the prosthesis, infection and up to and including death.  Patient understand the risks, benefits and expectations and wishes to proceed with surgery.   PCP: Aniceto Boss, MD  D/C Plans:       Home  Post-op Meds:       Rx given for ASA, Robaxin, Oxycodone 10mg , Iron, Colace and MiraLax  Tranexamic Acid:      To be given - IV   Decadron:      Is to be given  FYI:      ASA  Oxycodone 10 mg  DME:   Pt  Rx sent  PT:   [facility]  Pharmacy:  - [address] [place], [place]  Patient Active Problem List   Diagnosis Date Noted  . Spondylolisthesis of lumbar region 08/17/2017  . 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 Scoliosis of lumbar spine  03/10/2014   Past Medical History:  Diagnosis Date  . Anemia    hx of, none since menapause  . Anxiety   . Arthritis   . Burning mouth syndrome   . Fibromyalgia   . GERD (gastroesophageal reflux disease)    mild  . Peripheral vascular disease (HCC)   . Pneumonia    as a child  . PONV (postoperative nausea and vomiting)    "some" not too much  . Thin skin    on arms  . Venous reflux    surgery in oct and nov 2016    Past Surgical History:  Procedure Laterality Date  . ANTERIOR LATERAL LUMBAR FUSION 4 LEVELS Left 03/10/2014   Procedure: Left Lumbar Ome-Two,Lumbar Two-three,Lumbar Three-four,Lumbar four-Five Anterior lateral lumbar fusion with percutaneous pedicle screws and rods;  Surgeon: 05/10/2014, MD;  Location: [facility];  Service: Neurosurgery;  Laterality: Left;  left side approach  . BACK SURGERY  2013   cervical fusion  . CARPAL TUNNEL RELEASE Right ~1991  . COLONOSCOPY    . LUMBAR PERCUTANEOUS PEDICLE SCREW 4 LEVEL Left 03/10/2014   Procedure: LUMBAR PERCUTANEOUS PEDICLE SCREW LUMBAR ONE-TWO,LUMBAR TWO-THREE,LUMBARTHREE-FOUR,LUMBAR FOUR-FIVE;  Surgeon: 05/10/2014, MD;  Location: [facility];  Service: Neurosurgery;  Laterality: Left;  . NASAL SEPTUM SURGERY    . TONSILLECTOMY  as teenager  . TOTAL HIP ARTHROPLASTY Right 08/18/2014   Procedure: RIGHT  TOTAL HIP ARTHROPLASTY ANTERIOR APPROACH;  Surgeon: Paralee Cancel, MD;  Location: [facility];  Service: Orthopedics;  Laterality: Right;  . TUBAL LIGATION    . VAGINAL HYSTERECTOMY  ~2000    No current facility-administered medications for this encounter.    Current Outpatient Medications  Medication Sig Dispense Refill Last Dose  . escitalopram (LEXAPRO) 10 MG tablet Take 10 mg by mouth every evening.     . lidocaine (XYLOCAINE) 2 % solution Use as directed 5 mLs in the mouth or throat every 6 (six) hours as needed for mouth pain.      . naproxen sodium (ALEVE) 220 MG tablet Take 220 mg by mouth 2 (two) times daily as  needed (pain).     Marland Kitchen oxyCODONE-acetaminophen (PERCOCET) 10-325 MG tablet Take 1 tablet by mouth every 6 (six) hours as needed for pain. 60 tablet 0   . psyllium (METAMUCIL) 58.6 % packet Take 1 packet by mouth daily as needed (constipation).     . vitamin C (ASCORBIC ACID) 500 MG tablet Take 500 mg by mouth daily.      Allergies  Allergen Reactions  . Penicillins Rash    Did it involve swelling of the face/tongue/throat, SOB, or low BP? No Did it involve sudden or severe rash/hives, skin peeling, or any reaction on the inside of your mouth or nose? No Did you need to seek medical attention at a hospital or doctor's office? No When did it last happen?Childhood allergy If all above answers are "NO", may proceed with cephalosporin use.     Social History   Tobacco Use  . Smoking status: Never Smoker  . Smokeless tobacco: Never Used  Substance Use Topics  . Alcohol use: Yes    Comment: rare    Family History  Problem Relation Age of Onset  . CAD Mother   . Diabetes type II Mother   . Arthritis Mother   . COPD Father   . CAD Brother   . Diabetes type II Brother      Review of Systems  Constitutional: Negative.   HENT: Negative.   Eyes: Negative.   Respiratory: Negative.   Cardiovascular: Negative.   Gastrointestinal: Positive for heartburn.  Genitourinary: Negative.   Musculoskeletal: Positive for back pain and joint pain.  Skin: Negative.   Neurological: Negative.   Endo/Heme/Allergies: Negative.   Psychiatric/Behavioral: The patient is nervous/anxious.     Objective:  Physical Exam  Constitutional: She is oriented to person, place, and time. She appears well-developed.  HENT:  Head: Normocephalic.  Eyes: Pupils are equal, round, and reactive to light.  Neck: Neck supple. No JVD present. No tracheal deviation present. No thyromegaly present.  Cardiovascular: Normal rate, regular rhythm and intact distal pulses.  Respiratory: Effort normal and breath sounds  normal. No respiratory distress. She has no wheezes.  GI: Soft. There is no abdominal tenderness. There is no guarding.  Musculoskeletal:     Left hip: She exhibits decreased range of motion, decreased strength, tenderness and bony tenderness. She exhibits no swelling, no deformity and no laceration.  Lymphadenopathy:    She has no cervical adenopathy.  Neurological: She is alert and oriented to person, place, and time.  Skin: Skin is warm and dry.  Psychiatric: She has a normal mood and affect.     Labs:  Estimated body mass index is 27.35 kg/m as calculated from the following:   Height as of 08/14/17: 5\' 9"  (1.753 m).   Weight as of 08/14/17: 84 kg.  Imaging Review Plain radiographs demonstrate severe degenerative joint disease of the left hip(s). The bone quality appears to be good for age and reported activity level.      Assessment/Plan:  End stage arthritis, left hip(s)  The patient history, physical examination, clinical judgement of the provider and imaging studies are consistent with end stage degenerative joint disease of the left hip(s) and total hip arthroplasty is deemed medically necessary. The treatment options including medical management, injection therapy, arthroscopy and arthroplasty were discussed at length. The risks and benefits of total hip arthroplasty were presented and reviewed. The risks due to aseptic loosening, infection, stiffness, dislocation/subluxation,  thromboembolic complications and other imponderables were discussed.  The patient acknowledged the explanation, agreed to proceed with the plan and consent was signed. Patient is being admitted for inpatient treatment for surgery, pain control, PT, OT, prophylactic antibiotics, VTE prophylaxis, progressive ambulation and ADL's and discharge planning.The patient is planning to be discharged home.     Anastasio AuerbachMatthew S. Lurie Mullane   PA-C  11/14/2018, 8:50 AM

## 2018-11-15 ENCOUNTER — Encounter (HOSPITAL_COMMUNITY): Payer: Self-pay

## 2018-11-15 ENCOUNTER — Other Ambulatory Visit (HOSPITAL_COMMUNITY)
Admission: RE | Admit: 2018-11-15 | Discharge: 2018-11-15 | Disposition: A | Discharge status: Home / Self Care | Payer: Medicare Other | Source: Ambulatory Visit | Attending: Orthopedic Surgery | Admitting: Orthopedic Surgery

## 2018-11-15 ENCOUNTER — Other Ambulatory Visit: Payer: Self-pay

## 2018-11-15 ENCOUNTER — Encounter (HOSPITAL_COMMUNITY)
Admission: RE | Admit: 2018-11-15 | Discharge: 2018-11-15 | Disposition: A | Discharge status: Home / Self Care | Payer: Medicare Other | Source: Ambulatory Visit | Attending: Orthopedic Surgery | Admitting: Orthopedic Surgery

## 2018-11-15 DIAGNOSIS — M25552 Pain in left hip: Secondary | ICD-10-CM | POA: Diagnosis not present

## 2018-11-15 DIAGNOSIS — Z01812 Encounter for preprocedural laboratory examination: Secondary | ICD-10-CM | POA: Diagnosis present

## 2018-11-15 DIAGNOSIS — M1612 Unilateral primary osteoarthritis, left hip: Secondary | ICD-10-CM | POA: Insufficient documentation

## 2018-11-15 DIAGNOSIS — Z20828 Contact with and (suspected) exposure to other viral communicable diseases: Secondary | ICD-10-CM | POA: Diagnosis not present

## 2018-11-15 LAB — BASIC METABOLIC PANEL
Anion gap: 9 (ref 5–15)
BUN: 17 mg/dL (ref 8–23)
CO2: 24 mmol/L (ref 22–32)
Calcium: 9 mg/dL (ref 8.9–10.3)
Chloride: 107 mmol/L (ref 98–111)
Creatinine, Ser: 0.73 mg/dL (ref 0.44–1.00)
GFR calc Af Amer: >60 mL/min (ref >60)
GFR calc non Af Amer: >60 mL/min (ref >60)
Glucose, Bld: 99 mg/dL (ref 70–99)
Potassium: 3.8 mmol/L (ref 3.5–5.1)
Sodium: 140 mmol/L (ref 135–145)

## 2018-11-15 LAB — CBC
HCT: 39.5 % (ref 36.0–46.0)
Hemoglobin: 12.6 g/dL (ref 12.0–15.0)
MCH: 31 pg (ref 26.0–34.0)
MCHC: 31.9 g/dL (ref 30.0–36.0)
MCV: 97.3 fL (ref 80.0–100.0)
Platelets: 321 10*3/uL (ref 150–400)
RBC: 4.06 MIL/uL (ref 3.87–5.11)
RDW: 13.2 % (ref 11.5–15.5)
WBC: 7.7 10*3/uL (ref 4.0–10.5)
nRBC: 0 % (ref 0.0–0.2)

## 2018-11-15 LAB — SURGICAL PCR SCREEN
MRSA, PCR: NEGATIVE
Staphylococcus aureus: POSITIVE — AB

## 2018-11-15 NOTE — Progress Notes (Signed)
PCP - Dr. Carlena Hurl Cardiologist - none  Chest x-ray - no EKG - no Stress Test - no ECHO - no Cardiac Cath - no  Sleep Study - no CPAP -   Fasting Blood Sugar - NA Checks Blood Sugar _____ times a day  Blood Thinner Instructions:  NA Aspirin Instructions: Last Dose:  Anesthesia review:   Patient denies shortness of breath, fever, cough and chest pain at PAT appointment  yes Patient verbalized understanding of instructions that were given to them at the PAT appointment. Patient was also instructed that they will need to review over the PAT instructions again at home before surgery. yes

## 2018-11-16 LAB — NOVEL CORONAVIRUS, NAA (HOSP ORDER, SEND-OUT TO REF LAB; TAT 18-24 HRS): SARS-CoV-2, NAA: NOT DETECTED

## 2018-11-19 ENCOUNTER — Inpatient Hospital Stay (HOSPITAL_COMMUNITY): Payer: Medicare Other

## 2018-11-19 ENCOUNTER — Inpatient Hospital Stay (HOSPITAL_COMMUNITY): Payer: Medicare Other | Admitting: Certified Registered"

## 2018-11-19 ENCOUNTER — Inpatient Hospital Stay (HOSPITAL_COMMUNITY): Payer: Medicare Other | Admitting: Physician Assistant

## 2018-11-19 ENCOUNTER — Encounter (HOSPITAL_COMMUNITY)
Admission: RE | Disposition: A | Discharge status: Home / Self Care | Payer: Self-pay | Source: Home / Self Care | Attending: Orthopedic Surgery

## 2018-11-19 ENCOUNTER — Other Ambulatory Visit: Payer: Self-pay

## 2018-11-19 ENCOUNTER — Encounter (HOSPITAL_COMMUNITY): Payer: Self-pay | Admitting: *Deleted

## 2018-11-19 ENCOUNTER — Inpatient Hospital Stay (HOSPITAL_COMMUNITY)
Admission: RE | Admit: 2018-11-19 | Discharge: 2018-11-21 | DRG: 470 | Disposition: A | Discharge status: Home / Self Care | Payer: Medicare Other | Attending: Orthopedic Surgery | Admitting: Orthopedic Surgery

## 2018-11-19 DIAGNOSIS — M4316 Spondylolisthesis, lumbar region: Secondary | ICD-10-CM | POA: Diagnosis present

## 2018-11-19 DIAGNOSIS — Z825 Family history of asthma and other chronic lower respiratory diseases: Secondary | ICD-10-CM

## 2018-11-19 DIAGNOSIS — Z96642 Presence of left artificial hip joint: Secondary | ICD-10-CM

## 2018-11-19 DIAGNOSIS — Z79899 Other long term (current) drug therapy: Secondary | ICD-10-CM

## 2018-11-19 DIAGNOSIS — M797 Fibromyalgia: Secondary | ICD-10-CM | POA: Diagnosis present

## 2018-11-19 DIAGNOSIS — M25752 Osteophyte, left hip: Secondary | ICD-10-CM | POA: Diagnosis not present

## 2018-11-19 DIAGNOSIS — M1612 Unilateral primary osteoarthritis, left hip: Principal | ICD-10-CM | POA: Diagnosis present

## 2018-11-19 DIAGNOSIS — Z8249 Family history of ischemic heart disease and other diseases of the circulatory system: Secondary | ICD-10-CM | POA: Diagnosis not present

## 2018-11-19 DIAGNOSIS — K219 Gastro-esophageal reflux disease without esophagitis: Secondary | ICD-10-CM | POA: Diagnosis not present

## 2018-11-19 DIAGNOSIS — Z6825 Body mass index (BMI) 25.0-25.9, adult: Secondary | ICD-10-CM | POA: Diagnosis not present

## 2018-11-19 DIAGNOSIS — M419 Scoliosis, unspecified: Secondary | ICD-10-CM | POA: Diagnosis present

## 2018-11-19 DIAGNOSIS — F419 Anxiety disorder, unspecified: Secondary | ICD-10-CM | POA: Diagnosis present

## 2018-11-19 DIAGNOSIS — Z880 Allergy status to penicillin: Secondary | ICD-10-CM

## 2018-11-19 DIAGNOSIS — Z96649 Presence of unspecified artificial hip joint: Secondary | ICD-10-CM

## 2018-11-19 DIAGNOSIS — Z79891 Long term (current) use of opiate analgesic: Secondary | ICD-10-CM

## 2018-11-19 DIAGNOSIS — Z20828 Contact with and (suspected) exposure to other viral communicable diseases: Secondary | ICD-10-CM | POA: Diagnosis not present

## 2018-11-19 DIAGNOSIS — E663 Overweight: Secondary | ICD-10-CM | POA: Diagnosis present

## 2018-11-19 DIAGNOSIS — I739 Peripheral vascular disease, unspecified: Secondary | ICD-10-CM | POA: Diagnosis present

## 2018-11-19 DIAGNOSIS — Z8261 Family history of arthritis: Secondary | ICD-10-CM | POA: Diagnosis not present

## 2018-11-19 DIAGNOSIS — Z833 Family history of diabetes mellitus: Secondary | ICD-10-CM

## 2018-11-19 DIAGNOSIS — Z419 Encounter for procedure for purposes other than remedying health state, unspecified: Secondary | ICD-10-CM

## 2018-11-19 HISTORY — PX: TOTAL HIP ARTHROPLASTY: SHX124

## 2018-11-19 LAB — TYPE AND SCREEN
ABO/RH(D): O POS
Antibody Screen: NEGATIVE

## 2018-11-19 SURGERY — ARTHROPLASTY, HIP, TOTAL, ANTERIOR APPROACH
Anesthesia: General | Site: Hip | Laterality: Left

## 2018-11-19 MED ORDER — ACETAMINOPHEN 160 MG/5ML PO SOLN: 325.0000 mg | Freq: Once | ORAL | Status: DC | PRN

## 2018-11-19 MED ORDER — ACETAMINOPHEN 325 MG PO TABS: 325.0000 mg | ORAL_TABLET | Freq: Once | ORAL | Status: DC | PRN

## 2018-11-19 MED ORDER — ASPIRIN 81 MG PO CHEW
81.0000 mg | CHEWABLE_TABLET | Freq: Two times a day (BID) | ORAL | Status: DC
  Administered 2018-11-19 – 2018-11-21 (×4): 81 mg via ORAL
  Filled 2018-11-19 (×4): qty 1

## 2018-11-19 MED ORDER — MEPERIDINE HCL 50 MG/ML IJ SOLN: 6.2500 mg | INTRAMUSCULAR | Status: DC | PRN

## 2018-11-19 MED ORDER — METOCLOPRAMIDE HCL 5 MG/ML IJ SOLN: 5.0000 mg | Freq: Three times a day (TID) | INTRAMUSCULAR | Status: DC | PRN

## 2018-11-19 MED ORDER — LIDOCAINE VISCOUS HCL 2 % MT SOLN
5.0000 mL | Freq: Four times a day (QID) | OROMUCOSAL | Status: DC | PRN
  Filled 2018-11-19: qty 15

## 2018-11-19 MED ORDER — OXYCODONE HCL 5 MG PO TABS
ORAL_TABLET | ORAL | Status: AC
  Filled 2018-11-19: qty 2

## 2018-11-19 MED ORDER — TRANEXAMIC ACID-NACL 1000-0.7 MG/100ML-% IV SOLN
1000.0000 mg | Freq: Once | INTRAVENOUS | Status: AC
  Administered 2018-11-19: 1000 mg via INTRAVENOUS
  Filled 2018-11-19: qty 100

## 2018-11-19 MED ORDER — OXYCODONE HCL 5 MG PO TABS
5.0000 mg | ORAL_TABLET | ORAL | Status: DC | PRN
  Administered 2018-11-19 (×2): 10 mg via ORAL
  Filled 2018-11-19: qty 2

## 2018-11-19 MED ORDER — MAGNESIUM CITRATE PO SOLN: 1.0000 | Freq: Once | ORAL | Status: DC | PRN

## 2018-11-19 MED ORDER — PHENYLEPHRINE 40 MCG/ML (10ML) SYRINGE FOR IV PUSH (FOR BLOOD PRESSURE SUPPORT)
PREFILLED_SYRINGE | INTRAVENOUS | Status: DC | PRN
  Administered 2018-11-19: 80 ug via INTRAVENOUS
  Administered 2018-11-19: 120 ug via INTRAVENOUS
  Administered 2018-11-19 (×2): 80 ug via INTRAVENOUS
  Administered 2018-11-19: 120 ug via INTRAVENOUS
  Administered 2018-11-19: 80 ug via INTRAVENOUS
  Administered 2018-11-19: 120 ug via INTRAVENOUS
  Administered 2018-11-19: 40 ug via INTRAVENOUS

## 2018-11-19 MED ORDER — LIDOCAINE 2% (20 MG/ML) 5 ML SYRINGE
INTRAMUSCULAR | Status: DC | PRN
  Administered 2018-11-19: 60 mg via INTRAVENOUS

## 2018-11-19 MED ORDER — LACTATED RINGERS IV SOLN
INTRAVENOUS | Status: DC
  Administered 2018-11-19: 11:00:00 via INTRAVENOUS

## 2018-11-19 MED ORDER — PSYLLIUM 95 % PO PACK
1.0000 | PACK | Freq: Every day | ORAL | Status: DC | PRN
  Filled 2018-11-19: qty 1

## 2018-11-19 MED ORDER — CEFAZOLIN SODIUM-DEXTROSE 2-4 GM/100ML-% IV SOLN
2.0000 g | INTRAVENOUS | Status: AC
  Administered 2018-11-19: 13:00:00 2 g via INTRAVENOUS
  Filled 2018-11-19: qty 100

## 2018-11-19 MED ORDER — CEFAZOLIN SODIUM-DEXTROSE 2-4 GM/100ML-% IV SOLN: 2.0000 g | Freq: Four times a day (QID) | INTRAVENOUS | Status: DC

## 2018-11-19 MED ORDER — SUGAMMADEX SODIUM 500 MG/5ML IV SOLN
INTRAVENOUS | Status: AC
  Filled 2018-11-19: qty 10

## 2018-11-19 MED ORDER — HYDROMORPHONE HCL 1 MG/ML IJ SOLN: 0.5000 mg | INTRAMUSCULAR | Status: DC | PRN

## 2018-11-19 MED ORDER — SODIUM CHLORIDE 0.9 % IR SOLN
Status: DC | PRN
  Administered 2018-11-19: 1000 mL

## 2018-11-19 MED ORDER — ALBUMIN HUMAN 5 % IV SOLN
INTRAVENOUS | Status: AC
  Filled 2018-11-19: qty 250

## 2018-11-19 MED ORDER — ALUM & MAG HYDROXIDE-SIMETH 200-200-20 MG/5ML PO SUSP: 15.0000 mL | ORAL | Status: DC | PRN

## 2018-11-19 MED ORDER — ONDANSETRON HCL 4 MG/2ML IJ SOLN
INTRAMUSCULAR | Status: DC | PRN
  Administered 2018-11-19: 4 mg via INTRAVENOUS

## 2018-11-19 MED ORDER — POLYETHYLENE GLYCOL 3350 17 G PO PACK
17.0000 g | PACK | Freq: Two times a day (BID) | ORAL | Status: DC
  Administered 2018-11-19 – 2018-11-21 (×3): 17 g via ORAL
  Filled 2018-11-19 (×3): qty 1

## 2018-11-19 MED ORDER — ACETAMINOPHEN 10 MG/ML IV SOLN: 1000.0000 mg | Freq: Once | INTRAVENOUS | Status: DC | PRN

## 2018-11-19 MED ORDER — PROPOFOL 10 MG/ML IV BOLUS
INTRAVENOUS | Status: AC
  Filled 2018-11-19: qty 20

## 2018-11-19 MED ORDER — MENTHOL 3 MG MT LOZG: 1.0000 | LOZENGE | OROMUCOSAL | Status: DC | PRN

## 2018-11-19 MED ORDER — ESCITALOPRAM OXALATE 10 MG PO TABS
10.0000 mg | ORAL_TABLET | Freq: Every evening | ORAL | Status: DC
  Administered 2018-11-19 – 2018-11-20 (×2): 10 mg via ORAL
  Filled 2018-11-19 (×2): qty 1

## 2018-11-19 MED ORDER — CHLORHEXIDINE GLUCONATE 4 % EX LIQD: 60.0000 mL | Freq: Once | CUTANEOUS | Status: DC

## 2018-11-19 MED ORDER — SCOPOLAMINE 1 MG/3DAYS TD PT72
MEDICATED_PATCH | TRANSDERMAL | Status: AC
  Administered 2018-11-19: 1.5 mg via TRANSDERMAL
  Filled 2018-11-19: qty 1

## 2018-11-19 MED ORDER — DOCUSATE SODIUM 100 MG PO CAPS
100.0000 mg | ORAL_CAPSULE | Freq: Two times a day (BID) | ORAL | Status: DC
  Administered 2018-11-19 – 2018-11-21 (×4): 100 mg via ORAL
  Filled 2018-11-19 (×4): qty 1

## 2018-11-19 MED ORDER — ACETAMINOPHEN 10 MG/ML IV SOLN
INTRAVENOUS | Status: AC
  Filled 2018-11-19: qty 100

## 2018-11-19 MED ORDER — PROMETHAZINE HCL 25 MG/ML IJ SOLN: 6.2500 mg | INTRAMUSCULAR | Status: DC | PRN

## 2018-11-19 MED ORDER — SCOPOLAMINE 1 MG/3DAYS TD PT72
1.0000 | MEDICATED_PATCH | TRANSDERMAL | Status: DC
  Administered 2018-11-19: 11:00:00 1.5 mg via TRANSDERMAL

## 2018-11-19 MED ORDER — LACTATED RINGERS IV SOLN: INTRAVENOUS | Status: DC

## 2018-11-19 MED ORDER — DEXAMETHASONE SODIUM PHOSPHATE 10 MG/ML IJ SOLN: 10.0000 mg | Freq: Once | INTRAMUSCULAR | Status: DC

## 2018-11-19 MED ORDER — FENTANYL CITRATE (PF) 100 MCG/2ML IJ SOLN
INTRAMUSCULAR | Status: DC | PRN
  Administered 2018-11-19: 100 ug via INTRAVENOUS
  Administered 2018-11-19 (×2): 50 ug via INTRAVENOUS

## 2018-11-19 MED ORDER — HYDROMORPHONE HCL 1 MG/ML IJ SOLN
0.2500 mg | INTRAMUSCULAR | Status: DC | PRN
  Administered 2018-11-19 (×3): 0.5 mg via INTRAVENOUS

## 2018-11-19 MED ORDER — ROCURONIUM BROMIDE 10 MG/ML (PF) SYRINGE
PREFILLED_SYRINGE | INTRAVENOUS | Status: DC | PRN
  Administered 2018-11-19: 60 mg via INTRAVENOUS
  Administered 2018-11-19: 20 mg via INTRAVENOUS

## 2018-11-19 MED ORDER — DIPHENHYDRAMINE HCL 12.5 MG/5ML PO ELIX: 12.5000 mg | ORAL_SOLUTION | ORAL | Status: DC | PRN

## 2018-11-19 MED ORDER — OXYCODONE HCL 5 MG PO TABS
10.0000 mg | ORAL_TABLET | ORAL | Status: DC | PRN
  Administered 2018-11-20 (×5): 15 mg via ORAL
  Administered 2018-11-21 (×2): 10 mg via ORAL
  Filled 2018-11-19 (×3): qty 3
  Filled 2018-11-19: qty 2
  Filled 2018-11-19 (×3): qty 3
  Filled 2018-11-19: qty 2

## 2018-11-19 MED ORDER — POVIDONE-IODINE 10 % EX SWAB
2.0000 "application " | Freq: Once | CUTANEOUS | Status: AC
  Administered 2018-11-19: 2 via TOPICAL

## 2018-11-19 MED ORDER — MIDAZOLAM HCL 2 MG/2ML IJ SOLN
INTRAMUSCULAR | Status: AC
  Filled 2018-11-19: qty 2

## 2018-11-19 MED ORDER — METHOCARBAMOL 500 MG IVPB - SIMPLE MED
INTRAVENOUS | Status: AC
  Filled 2018-11-19: qty 50

## 2018-11-19 MED ORDER — HYDROMORPHONE HCL 1 MG/ML IJ SOLN
0.2500 mg | INTRAMUSCULAR | Status: DC | PRN
  Administered 2018-11-19: 0.5 mg via INTRAVENOUS

## 2018-11-19 MED ORDER — ALBUMIN HUMAN 5 % IV SOLN
INTRAVENOUS | Status: DC | PRN
  Administered 2018-11-19 (×2): via INTRAVENOUS

## 2018-11-19 MED ORDER — ACETAMINOPHEN 500 MG PO TABS
1000.0000 mg | ORAL_TABLET | Freq: Four times a day (QID) | ORAL | Status: AC
  Administered 2018-11-19 – 2018-11-20 (×3): 1000 mg via ORAL
  Filled 2018-11-19 (×3): qty 2

## 2018-11-19 MED ORDER — ACETAMINOPHEN 10 MG/ML IV SOLN
1000.0000 mg | Freq: Once | INTRAVENOUS | Status: DC | PRN
  Administered 2018-11-19: 1000 mg via INTRAVENOUS

## 2018-11-19 MED ORDER — PHENOL 1.4 % MT LIQD: 1.0000 | OROMUCOSAL | Status: DC | PRN

## 2018-11-19 MED ORDER — METHOCARBAMOL 500 MG PO TABS
500.0000 mg | ORAL_TABLET | Freq: Four times a day (QID) | ORAL | Status: DC | PRN
  Administered 2018-11-19 – 2018-11-21 (×3): 500 mg via ORAL
  Filled 2018-11-19 (×3): qty 1

## 2018-11-19 MED ORDER — MIDAZOLAM HCL 2 MG/2ML IJ SOLN
INTRAMUSCULAR | Status: DC | PRN
  Administered 2018-11-19: 2 mg via INTRAVENOUS

## 2018-11-19 MED ORDER — FENTANYL CITRATE (PF) 100 MCG/2ML IJ SOLN
INTRAMUSCULAR | Status: AC
  Filled 2018-11-19: qty 2

## 2018-11-19 MED ORDER — DEXAMETHASONE SODIUM PHOSPHATE 10 MG/ML IJ SOLN
10.0000 mg | Freq: Once | INTRAMUSCULAR | Status: AC
  Administered 2018-11-19: 10 mg via INTRAVENOUS

## 2018-11-19 MED ORDER — HYDROMORPHONE HCL 1 MG/ML IJ SOLN
INTRAMUSCULAR | Status: AC
  Filled 2018-11-19: qty 2

## 2018-11-19 MED ORDER — STERILE WATER FOR IRRIGATION IR SOLN
Status: DC | PRN
  Administered 2018-11-19 (×2): 1000 mL

## 2018-11-19 MED ORDER — ONDANSETRON HCL 4 MG/2ML IJ SOLN
4.0000 mg | Freq: Four times a day (QID) | INTRAMUSCULAR | Status: DC | PRN
  Administered 2018-11-20: 23:00:00 4 mg via INTRAVENOUS
  Filled 2018-11-19: qty 2

## 2018-11-19 MED ORDER — PROPOFOL 10 MG/ML IV BOLUS
INTRAVENOUS | Status: DC | PRN
  Administered 2018-11-19: 150 mg via INTRAVENOUS

## 2018-11-19 MED ORDER — FERROUS SULFATE 325 (65 FE) MG PO TABS
325.0000 mg | ORAL_TABLET | Freq: Three times a day (TID) | ORAL | Status: DC
  Administered 2018-11-20 – 2018-11-21 (×5): 325 mg via ORAL
  Filled 2018-11-19 (×5): qty 1

## 2018-11-19 MED ORDER — SUGAMMADEX SODIUM 500 MG/5ML IV SOLN
INTRAVENOUS | Status: DC | PRN
  Administered 2018-11-19: 200 mg via INTRAVENOUS

## 2018-11-19 MED ORDER — TRANEXAMIC ACID-NACL 1000-0.7 MG/100ML-% IV SOLN
1000.0000 mg | INTRAVENOUS | Status: AC
  Administered 2018-11-19: 13:00:00 1000 mg via INTRAVENOUS
  Filled 2018-11-19: qty 100

## 2018-11-19 MED ORDER — SODIUM CHLORIDE 0.9 % IV SOLN
INTRAVENOUS | Status: DC
  Administered 2018-11-19: 15:00:00 via INTRAVENOUS

## 2018-11-19 MED ORDER — ONDANSETRON HCL 4 MG PO TABS: 4.0000 mg | ORAL_TABLET | Freq: Four times a day (QID) | ORAL | Status: DC | PRN

## 2018-11-19 MED ORDER — METOCLOPRAMIDE HCL 5 MG PO TABS: 5.0000 mg | ORAL_TABLET | Freq: Three times a day (TID) | ORAL | Status: DC | PRN

## 2018-11-19 MED ORDER — METHOCARBAMOL 500 MG IVPB - SIMPLE MED
500.0000 mg | Freq: Four times a day (QID) | INTRAVENOUS | Status: DC | PRN
  Administered 2018-11-19: 14:00:00 500 mg via INTRAVENOUS
  Filled 2018-11-19: qty 50

## 2018-11-19 MED ORDER — PROPOFOL 10 MG/ML IV BOLUS
INTRAVENOUS | Status: AC
  Filled 2018-11-19: qty 80

## 2018-11-19 MED ORDER — BISACODYL 10 MG RE SUPP: 10.0000 mg | Freq: Every day | RECTAL | Status: DC | PRN

## 2018-11-19 SURGICAL SUPPLY — 46 items
BAG DECANTER FOR FLEXI CONT (MISCELLANEOUS) IMPLANT
BAG ZIPLOCK 12X15 (MISCELLANEOUS) IMPLANT
BLADE SAG 18X100X1.27 (BLADE) ×3 IMPLANT
BLADE SURG SZ10 CARB STEEL (BLADE) ×6 IMPLANT
COVER PERINEAL POST (MISCELLANEOUS) ×3 IMPLANT
COVER SURGICAL LIGHT HANDLE (MISCELLANEOUS) ×3 IMPLANT
COVER WAND RF STERILE (DRAPES) IMPLANT
CUP ACETBLR 52 OD PINNACLE (Hips) ×3 IMPLANT
DERMABOND ADVANCED (GAUZE/BANDAGES/DRESSINGS) ×2
DERMABOND ADVANCED .7 DNX12 (GAUZE/BANDAGES/DRESSINGS) ×1 IMPLANT
DRAPE STERI IOBAN 125X83 (DRAPES) ×3 IMPLANT
DRAPE U-SHAPE 47X51 STRL (DRAPES) ×6 IMPLANT
DRESSING AQUACEL AG SP 3.5X10 (GAUZE/BANDAGES/DRESSINGS) ×1 IMPLANT
DRSG AQUACEL AG ADV 3.5X10 (GAUZE/BANDAGES/DRESSINGS) ×3 IMPLANT
DRSG AQUACEL AG SP 3.5X10 (GAUZE/BANDAGES/DRESSINGS) ×3
DURAPREP 26ML APPLICATOR (WOUND CARE) ×3 IMPLANT
ELECT BLADE TIP CTD 4 INCH (ELECTRODE) ×3 IMPLANT
ELECT REM PT RETURN 15FT ADLT (MISCELLANEOUS) ×3 IMPLANT
ELIMINATOR HOLE APEX DEPUY (Hips) ×3 IMPLANT
GLOVE BIO SURGEON STRL SZ 6 (GLOVE) ×6 IMPLANT
GLOVE BIOGEL PI IND STRL 6.5 (GLOVE) ×1 IMPLANT
GLOVE BIOGEL PI IND STRL 7.5 (GLOVE) ×1 IMPLANT
GLOVE BIOGEL PI IND STRL 8.5 (GLOVE) ×1 IMPLANT
GLOVE BIOGEL PI INDICATOR 6.5 (GLOVE) ×2
GLOVE BIOGEL PI INDICATOR 7.5 (GLOVE) ×2
GLOVE BIOGEL PI INDICATOR 8.5 (GLOVE) ×2
GLOVE ECLIPSE 8.0 STRL XLNG CF (GLOVE) ×6 IMPLANT
GLOVE ORTHO TXT STRL SZ7.5 (GLOVE) ×6 IMPLANT
GOWN STRL REUS W/TWL LRG LVL3 (GOWN DISPOSABLE) ×6 IMPLANT
GOWN STRL REUS W/TWL XL LVL3 (GOWN DISPOSABLE) ×3 IMPLANT
HEAD CERAMIC DELTA 36 PLUS 1.5 (Hips) ×3 IMPLANT
HOLDER FOLEY CATH W/STRAP (MISCELLANEOUS) ×3 IMPLANT
KIT TURNOVER KIT A (KITS) IMPLANT
LINER NEUTRAL 52X36MM PLUS 4 (Liner) ×3 IMPLANT
PACK ANTERIOR HIP CUSTOM (KITS) ×3 IMPLANT
SCREW 6.5MMX30MM (Screw) ×3 IMPLANT
STEM FEMORAL SZ6 HIGH ACTIS (Stem) ×3 IMPLANT
SUT MNCRL AB 4-0 PS2 18 (SUTURE) ×3 IMPLANT
SUT STRATAFIX 0 PDS 27 VIOLET (SUTURE) ×3
SUT VIC AB 1 CT1 36 (SUTURE) ×9 IMPLANT
SUT VIC AB 2-0 CT1 27 (SUTURE) ×4
SUT VIC AB 2-0 CT1 TAPERPNT 27 (SUTURE) ×2 IMPLANT
SUTURE STRATFX 0 PDS 27 VIOLET (SUTURE) ×1 IMPLANT
TRAY FOLEY MTR SLVR 16FR STAT (SET/KITS/TRAYS/PACK) IMPLANT
WATER STERILE IRR 1000ML POUR (IV SOLUTION) ×3 IMPLANT
YANKAUER SUCT BULB TIP 10FT TU (MISCELLANEOUS) IMPLANT

## 2018-11-19 NOTE — Interval H&P Note (Signed)
History and Physical Interval Note:  11/19/2018 11:18 AM  Robyn Smith  has presented today for surgery, with the diagnosis of Left hip osteoarthritis.  The various methods of treatment have been discussed with the patient and family. After consideration of risks, benefits and other options for treatment, the patient has consented to  Procedure(s) with comments: TOTAL HIP ARTHROPLASTY ANTERIOR APPROACH (Left) - 70 mins as a surgical intervention.  The patient's history has been reviewed, patient examined, no change in status, stable for surgery.  I have reviewed the patient's chart and labs.  Questions were answered to the patient's satisfaction.     Mauri Pole

## 2018-11-19 NOTE — Transfer of Care (Signed)
Immediate Anesthesia Transfer of Care Note  Patient: Robyn Smith  Procedure(s) Performed: TOTAL HIP ARTHROPLASTY ANTERIOR APPROACH (Left Hip)  Patient Location: PACU  Anesthesia Type:General  Level of Consciousness: awake, alert , oriented and patient cooperative  Airway & Oxygen Therapy: Patient Spontanous Breathing and Patient connected to face mask oxygen  Post-op Assessment: Report given to RN and Post -op Vital signs reviewed and stable  Post vital signs: Reviewed and stable  Last Vitals:  Vitals Value Taken Time  BP 114/62 11/19/18 1409  Temp    Pulse 70 11/19/18 1412  Resp 12 11/19/18 1412  SpO2 100 % 11/19/18 1412  Vitals shown include unvalidated device data.  Last Pain:  Vitals:   11/19/18 1044  TempSrc:   PainSc: 5       Patients Stated Pain Goal: 4 (55/73/22 0254)  Complications: No apparent anesthesia complications

## 2018-11-19 NOTE — Op Note (Signed)
NAME:  Robyn Smith                ACCOUNT NO.: [id number]      MEDICAL RECORD NO.: [id number]      FACILITY[facility]  [facility]      PHYSICIAN:  Shelda PalMatthew D Alix Stowers  DATE OF BIRTH:  Jul 29, 1955     DATE OF PROCEDURE:  11/19/2018                                 OPERATIVE REPORT         PREOPERATIVE DIAGNOSIS: Left  hip osteoarthritis.      POSTOPERATIVE DIAGNOSIS:  Left hip osteoarthritis.      PROCEDURE:  Left total hip replacement through an anterior approach   utilizing DePuy THR system, component size 52mm pinnacle cup, a size 36+4 neutral   Altrex liner, a size 6 Hi Actis stem with a 36+1.5 delta ceramic   ball.      SURGEON:  Madlyn FrankelMatthew D. Charlann Boxerlin, M.D.      ASSISTANT:  Lanney GinsMatthew Babish, PA-C     ANESTHESIA:  Spinal.      SPECIMENS:  None.      COMPLICATIONS:  None.      BLOOD LOSS:  1000 cc     DRAINS:  None.      INDICATION OF THE PROCEDURE:  Robyn Smith is a 63 y.o. female who had   presented to office for evaluation of left hip pain.  Radiographs revealed   progressive degenerative changes with bone-on-bone   articulation of the  hip joint, including subchondral cystic changes and osteophytes.  The patient had painful limited range of   motion significantly affecting their overall quality of life and function.  The patient was failing to    respond to conservative measures including medications and/or injections and activity modification and at this point was ready   to proceed with more definitive measures.  Consent was obtained for   benefit of pain relief.  Specific risks of infection, DVT, component   failure, dislocation, neurovascular injury, and need for revision surgery were reviewed in the office as well discussion of   the anterior versus posterior approach were reviewed.     PROCEDURE IN DETAIL:  The patient was brought to operative theater.   Once adequate anesthesia, preoperative antibiotics, 2 gm of Ancef, 1 gm of Tranexamic Acid, and 10 mg of  Decadron were administered, the patient was positioned supine on the [employer] table.  Once the patient was safely positioned with adequate padding of boney prominences we predraped out the hip, and used fluoroscopy to confirm orientation of the pelvis.      The left hip was then prepped and draped from proximal iliac crest to   mid thigh with a shower curtain technique.      Time-out was performed identifying the patient, planned procedure, and the appropriate extremity.     An incision was then made 2 cm lateral to the   anterior superior iliac spine extending over the orientation of the   tensor fascia lata muscle and sharp dissection was carried down to the   fascia of the muscle.      The fascia was then incised.  The muscle belly was identified and swept   laterally and retractor placed along the superior neck.  Following   cauterization of the circumflex vessels and removing some pericapsular  fat, a second cobra retractor was placed on the inferior neck.  A T-capsulotomy was made along the line of the   superior neck to the trochanteric fossa, then extended proximally and   distally.  Tag sutures were placed and the retractors were then placed   intracapsular.  We then identified the trochanteric fossa and   orientation of my neck cut and then made a neck osteotomy with the femur on traction.  The femoral   head was removed without difficulty or complication.  Traction was let   off and retractors were placed posterior and anterior around the   acetabulum.      The labrum and foveal tissue were debrided.  I began reaming with a 46 mm   reamer and reamed up to 51 mm reamer with good bony bed preparation and a 52 mm  cup was chosen.  The final 52 mm Pinnacle cup was then impacted under fluoroscopy to confirm the depth of penetration and orientation with respect to   Abduction and forward flexion.  A screw was placed into the ilium followed by the hole eliminator.  The final   36+4  neutral Altrex liner was impacted with good visualized rim fit.  The cup was positioned anatomically within the acetabular portion of the pelvis.      At this point, the femur was rolled to 100 degrees.  Further capsule was   released off the inferior aspect of the femoral neck.  I then   released the superior capsule proximally.  With the leg in a neutral position the hook was placed laterally   along the femur under the vastus lateralis origin and elevated manually and then held in position using the hook attachment on the bed.  The leg was then extended and adducted with the leg rolled to 100   degrees of external rotation.  Retractors were placed along the medial calcar and posteriorly over the greater trochanter.  Once the proximal femur was fully   exposed, I used a box osteotome to set orientation.  I then began   broaching with the starting chili pepper broach and passed this by hand and then broached up to 6.  With the 6 broach in place I chose a high offset neck and did several trial reductions.  The offset was appropriate, leg lengths   appeared to be equal best matched with the +1.5 head ball trial confirmed radiographically.   Given these findings, I went ahead and dislocated the hip, repositioned all   retractors and positioned the right hip in the extended and abducted position.  The final 6 Hi Actis stem was   chosen and it was impacted down to the level of neck cut.  Based on this   and the trial reductions, a final 36+1.5 delta ceramic ball was chosen and   impacted onto a clean and dry trunnion, and the hip was reduced.  The   hip had been irrigated throughout the case again at this point.  I did   reapproximate the superior capsular leaflet to the anterior leaflet   using #1 Vicryl.  The fascia of the   tensor fascia lata muscle was then reapproximated using #1 Vicryl and #0 Stratafix sutures.  The   remaining wound was closed with 2-0 Vicryl and running 4-0 Monocryl.   The  hip was cleaned, dried, and dressed sterilely using Dermabond and   Aquacel dressing.  The patient was then brought   to recovery room in  stable condition tolerating the procedure well.    Danae Orleans, PA-C was present for the entirety of the case involved from   preoperative positioning, perioperative retractor management, general   facilitation of the case, as well as primary wound closure as assistant.            Pietro Cassis Alvan Dame, M.D.        11/19/2018 1:45 PM

## 2018-11-19 NOTE — Discharge Instructions (Signed)

## 2018-11-19 NOTE — Evaluation (Signed)
Physical Therapy Evaluation Patient Details Name: Robyn Smith MRN: [id number] DOB: [birth year] Today's Date: 11/19/2018   History of Present Illness  Patient is 63 y.o. female s/p Lt THA anterior approach on 11/19/18 with PMH significant for PVD, OA, GERD,fibromyalgia, lumbar fusion L2-5, and Rt THA.    Clinical Impression  Robyn Smith is a 63 y.o. female POD 0 s/p Lt THA anterior approach. Patient reports independence with mobility at baseline. Patient is now limited by functional impairments (see PT problem list below) and requires min assist for transfers and gait with RW. Patient was able to ambulate ~75 feet with RW and intermittent assist for safe management of RW. Patient instructed in exercise to facilitate ROM and circulation. Patient will benefit from continued skilled PT interventions to address impairments and progress towards PLOF. Acute PT will follow to progress mobility and stair training in preparation for safe discharge home.    Follow Up Recommendations Follow surgeon's recommendation for DC plan and follow-up therapies    Equipment Recommendations  None recommended by PT    Recommendations for Other Services       Precautions / Restrictions Precautions Precautions: Fall Restrictions Weight Bearing Restrictions: No      Mobility  Bed Mobility Overal bed mobility: Needs Assistance Bed Mobility: Supine to Sit     Supine to sit: HOB elevated;Min guard     General bed mobility comments: cues for sequencing and use of bed rails, no physical assist required  Transfers Overall transfer level: Needs assistance Equipment used: Rolling walker (2 wheeled) Transfers: Sit to/from Stand Sit to Stand: From elevated surface;Min assist         General transfer comment: cues for safe hand placement and technique with RW, assist to initiate power up and steady with rising  Ambulation/Gait Ambulation/Gait assistance: Min assist;Min guard Gait Distance (Feet): 75  Feet Assistive device: Rolling walker (2 wheeled) Gait Pattern/deviations: Step-through pattern;Decreased stride length;Decreased stance time - left;Antalgic;Decreased weight shift to left Gait velocity: WNL's   General Gait Details: cues for safe hand placement at start, pt maintained throughout, pt required assist intermittently to maintain safe proximity to RW as she had tendency to step too close to the front, pt improved walker management throughout, no overt LOB noted  Stairs            Wheelchair Mobility    Modified Rankin (Stroke Patients Only)       Balance Overall balance assessment: Needs assistance Sitting-balance support: Feet supported;No upper extremity supported Sitting balance-Leahy Scale: Good     Standing balance support: During functional activity;Bilateral upper extremity supported Standing balance-Leahy Scale: Fair                Pertinent Vitals/Pain Pain Assessment: 0-10 Pain Score: 3  Pain Location: LT hip Pain Descriptors / Indicators: Aching;Sore Pain Intervention(s): Limited activity within patient's tolerance;Monitored during session;Repositioned;Ice applied    Home Living Family/patient expects to be discharged to:: Private residence Living Arrangements: Spouse/significant other Available Help at Discharge: Family;Available 24 hours/day Type of Home: House Home Access: Stairs to enter Entrance Stairs-Rails: Right;Left Entrance Stairs-Number of Steps: 6 at back wtih Rt/Lt hand rail, 2 platform steps at front, no rails Home Layout: One level;Laundry or work area in [place]: Kasandra Knudsen - single point;Walker - 2 wheels;Shower seat - built in;Shower seat;Bedside commode;Grab bars - tub/shower Additional Comments: pt front steps does not have rails but she feels safer going up there, plans to go in through front with RW  Prior Function Level of Independence: Independent               Hand Dominance   Dominant Hand:  Right    Extremity/Trunk Assessment   Upper Extremity Assessment Upper Extremity Assessment: Overall WFL for tasks assessed    Lower Extremity Assessment Lower Extremity Assessment: Overall WFL for tasks assessed;LLE deficits/detail LLE Deficits / Details: pt able to complete heel raise with some pain, quad strength 4/5 or better with MMT LLE Sensation: WNL LLE Coordination: WNL    Cervical / Trunk Assessment Cervical / Trunk Assessment: Normal  Communication   Communication: No difficulties  Cognition Arousal/Alertness: Awake/alert Behavior During Therapy: WFL for tasks assessed/performed Overall Cognitive Status: Within Functional Limits for tasks assessed           General Comments      Exercises Total Joint Exercises Ankle Circles/Pumps: AROM;10 reps;Seated;Both Heel Slides: AAROM;10 reps;Seated;Left   Assessment/Plan    PT Assessment Patient needs continued PT services  PT Problem List Decreased strength;Decreased balance;Decreased mobility;Decreased range of motion;Decreased activity tolerance;Decreased knowledge of use of DME       PT Treatment Interventions DME instruction;Functional mobility training;Balance training;Patient/family education;Modalities;Gait training;Therapeutic activities;Therapeutic exercise;Stair training    PT Goals (Current goals can be found in the Care Plan section)  Acute Rehab PT Goals Patient Stated Goal: to get back to walking without pain PT Goal Formulation: With patient Time For Goal Achievement: 11/26/18 Potential to Achieve Goals: Good    Frequency 7X/week    AM-PAC PT "6 Clicks" Mobility  Outcome Measure Help needed turning from your back to your side while in a flat bed without using bedrails?: A Little Help needed moving from lying on your back to sitting on the side of a flat bed without using bedrails?: A Little Help needed moving to and from a bed to a chair (including a wheelchair)?: A Little Help needed  standing up from a chair using your arms (e.g., wheelchair or bedside chair)?: A Little Help needed to walk in hospital room?: A Little Help needed climbing 3-5 steps with a railing? : A Little 6 Click Score: 18    End of Session Equipment Utilized During Treatment: Gait belt Activity Tolerance: Patient tolerated treatment well Patient left: in chair;with call bell/phone within reach;with chair alarm set Nurse Communication: Mobility status PT Visit Diagnosis: Muscle weakness (generalized) (M62.81);Difficulty in walking, not elsewhere classified (R26.2)    Time: 0076-2263 PT Time Calculation (min) (ACUTE ONLY): 24 min   Charges:   PT Evaluation $PT Eval Low Complexity: 1 Low PT Treatments $Therapeutic Exercise: 8-22 mins        Valentino Saxon, PT, DPT Physical Therapist with [facility]  11/19/2018 6:17 PM

## 2018-11-19 NOTE — Anesthesia Postprocedure Evaluation (Signed)
Anesthesia Post Note  Patient: Robyn Smith  Procedure(s) Performed: TOTAL HIP ARTHROPLASTY ANTERIOR APPROACH (Left Hip)     Patient location during evaluation: PACU Anesthesia Type: General Level of consciousness: awake and alert Pain management: pain level controlled Vital Signs Assessment: post-procedure vital signs reviewed and stable Respiratory status: spontaneous breathing, nonlabored ventilation, respiratory function stable and patient connected to nasal cannula oxygen Cardiovascular status: blood pressure returned to baseline and stable Postop Assessment: no apparent nausea or vomiting Anesthetic complications: no    Last Vitals:  Vitals:   11/19/18 1615 11/19/18 1632  BP:  109/66  Pulse: (!) 58 (!) 57  Resp:  16  Temp:  36.5 C  SpO2: 100% 97%    Last Pain:  Vitals:   11/19/18 1632  TempSrc: Oral  PainSc: 5                  Effie Berkshire

## 2018-11-19 NOTE — Anesthesia Preprocedure Evaluation (Addendum)
Anesthesia Evaluation  Patient identified by MRN, date of birth, ID band Patient awake    Reviewed: Allergy & Precautions, NPO status , Patient's Chart, lab work & pertinent test results  History of Anesthesia Complications (+) PONV  Airway Mallampati: I  TM Distance: >3 FB Neck ROM: Full    Dental  (+) Teeth Intact, Dental Advisory Given   Pulmonary    breath sounds clear to auscultation       Cardiovascular + Peripheral Vascular Disease   Rhythm:Regular Rate:Normal     Neuro/Psych Anxiety    GI/Hepatic Neg liver ROS, GERD  ,  Endo/Other  negative endocrine ROS  Renal/GU negative Renal ROS     Musculoskeletal  (+) Arthritis , Fibromyalgia -  Abdominal Normal abdominal exam  (+)   Peds  Hematology negative hematology ROS (+)   Anesthesia Other Findings   Reproductive/Obstetrics                            Anesthesia Physical Anesthesia Plan  ASA: III  Anesthesia Plan: General   Post-op Pain Management:    Induction: Intravenous  PONV Risk Score and Plan: 1 and Propofol infusion and Ondansetron  Airway Management Planned: Oral ETT  Additional Equipment: None  Intra-op Plan:   Post-operative Plan: Extubation in OR  Informed Consent: I have reviewed the patients History and Physical, chart, labs and discussed the procedure including the risks, benefits and alternatives for the proposed anesthesia with the patient or authorized representative who has indicated his/her understanding and acceptance.     Dental advisory given  Plan Discussed with: CRNA  Anesthesia Plan Comments:        Anesthesia Quick Evaluation

## 2018-11-19 NOTE — Anesthesia Procedure Notes (Signed)
Procedure Name: Intubation Date/Time: 11/19/2018 12:29 PM Performed by: Pilar Grammes, CRNA Pre-anesthesia Checklist: Patient identified, Emergency Drugs available, Suction available, Patient being monitored and Timeout performed Patient Re-evaluated:Patient Re-evaluated prior to induction Oxygen Delivery Method: Circle system utilized Preoxygenation: Pre-oxygenation with 100% oxygen Induction Type: IV induction Ventilation: Mask ventilation without difficulty Laryngoscope Size: Miller and 2 Grade View: Grade I Tube type: Oral Tube size: 7.5 mm Number of attempts: 1 Airway Equipment and Method: Stylet Placement Confirmation: positive ETCO2,  ETT inserted through vocal cords under direct vision,  CO2 detector and breath sounds checked- equal and bilateral Secured at: 23 cm Tube secured with: Tape Dental Injury: Teeth and Oropharynx as per pre-operative assessment

## 2018-11-20 DIAGNOSIS — E663 Overweight: Secondary | ICD-10-CM | POA: Diagnosis present

## 2018-11-20 LAB — BASIC METABOLIC PANEL
Anion gap: 5 (ref 5–15)
BUN: 13 mg/dL (ref 8–23)
CO2: 25 mmol/L (ref 22–32)
Calcium: 8.3 mg/dL — ABNORMAL LOW (ref 8.9–10.3)
Chloride: 109 mmol/L (ref 98–111)
Creatinine, Ser: 0.66 mg/dL (ref 0.44–1.00)
GFR calc Af Amer: >60 mL/min (ref >60)
GFR calc non Af Amer: >60 mL/min (ref >60)
Glucose, Bld: 138 mg/dL — ABNORMAL HIGH (ref 70–99)
Potassium: 4.2 mmol/L (ref 3.5–5.1)
Sodium: 139 mmol/L (ref 135–145)

## 2018-11-20 LAB — CBC
HCT: 27.6 % — ABNORMAL LOW (ref 36.0–46.0)
Hemoglobin: 8.6 g/dL — ABNORMAL LOW (ref 12.0–15.0)
MCH: 30.9 pg (ref 26.0–34.0)
MCHC: 31.2 g/dL (ref 30.0–36.0)
MCV: 99.3 fL (ref 80.0–100.0)
Platelets: 214 K/uL (ref 150–400)
RBC: 2.78 MIL/uL — ABNORMAL LOW (ref 3.87–5.11)
RDW: 13.2 % (ref 11.5–15.5)
WBC: 10.7 K/uL — ABNORMAL HIGH (ref 4.0–10.5)
nRBC: 0 % (ref 0.0–0.2)

## 2018-11-20 MED ORDER — ACETAMINOPHEN 500 MG PO TABS: 1000.0000 mg | ORAL_TABLET | Freq: Three times a day (TID) | ORAL | 0 refills | Status: AC

## 2018-11-20 MED ORDER — FERROUS SULFATE 325 (65 FE) MG PO TABS: 325.0000 mg | ORAL_TABLET | Freq: Three times a day (TID) | ORAL | 0 refills | Status: AC

## 2018-11-20 MED ORDER — OXYCODONE HCL 10 MG PO TABS: 10.0000 mg | ORAL_TABLET | ORAL | 0 refills | Status: DC | PRN

## 2018-11-20 MED ORDER — ASPIRIN 81 MG PO CHEW: 81.0000 mg | CHEWABLE_TABLET | Freq: Two times a day (BID) | ORAL | 0 refills | Status: AC

## 2018-11-20 MED ORDER — DOCUSATE SODIUM 100 MG PO CAPS: 100.0000 mg | ORAL_CAPSULE | Freq: Two times a day (BID) | ORAL | 0 refills | Status: AC

## 2018-11-20 MED ORDER — POLYETHYLENE GLYCOL 3350 17 G PO PACK: 17.0000 g | PACK | Freq: Two times a day (BID) | ORAL | 0 refills | Status: AC

## 2018-11-20 MED ORDER — METHOCARBAMOL 500 MG PO TABS: 500.0000 mg | ORAL_TABLET | Freq: Four times a day (QID) | ORAL | 0 refills | Status: DC | PRN

## 2018-11-20 NOTE — Progress Notes (Signed)
     Subjective: 1 Day Post-Op Procedure(s) (LRB): TOTAL HIP ARTHROPLASTY ANTERIOR APPROACH (Left)   Patient reports pain as moderate, pain for the most part is controlled with medication.  No reported events throughout the night.  We have discussed the procedure, findings and expectations moving forward.  Patient is ready be discharged home, if she does well therapy.  Patient follow-up in the clinic in 2 weeks.  Patient knows to call with any questions or concerns.   Objective:   VITALS:   Vitals:   11/20/18 0421 11/20/18 0428  BP: (!) 109/44 (!) 110/50  Pulse: (!) 54   Resp: 14   Temp: 98.1 F (36.7 C)   SpO2: 100%     Dorsiflexion/Plantar flexion intact Incision: dressing C/D/I No cellulitis present Compartment soft  LABS Recent Labs    11/20/18 0201  HGB 8.6*  HCT 27.6*  WBC 10.7*  PLT 214    Recent Labs    11/20/18 0201  NA 139  K 4.2  BUN 13  CREATININE 0.66  GLUCOSE 138*     Assessment/Plan: 1 Day Post-Op Procedure(s) (LRB): TOTAL HIP ARTHROPLASTY ANTERIOR APPROACH (Left) Foley cath DC'd Advance diet Up with therapy D/C IV fluids Discharge home Follow up in 2 weeks at [facility] Follow up with OLIN,Laiba Fuerte D in 2 weeks.  Contact information:  [facility] [address], [address] [place] [address] [phone]    Overweight (BMI 25-29.9) Estimated body mass index is 28.61 kg/m as calculated from the following:   Height as of this encounter: 5\' 10"  (1.778 m).   Weight as of this encounter: 90.4 kg. Patient also counseled that weight may inhibit the healing process Patient counseled that losing weight will help with future health issues     [place] Pugh. Chellsie Gomer   PAC  11/20/2018, 9:27 AM

## 2018-11-20 NOTE — Plan of Care (Signed)

## 2018-11-20 NOTE — Progress Notes (Signed)
Physical Therapy Treatment Patient Details Name: Robyn Smith MRN: [id number] DOB: [birth year] Today's Date: 11/20/2018    History of Present Illness Patient is 63 y.o. female s/p Lt THA anterior approach on 11/19/18 with PMH significant for PVD, OA, GERD,fibromyalgia, lumbar fusion L2-5, and Rt THA.    PT Comments    Pt assisted with performing exercises and then ambulated to bathroom.  Pt with increased pain with mobility and felt unsafe to perform steps today.  Pt does not appear ready for d/c home today.  RN notified.   Follow Up Recommendations  Follow surgeon's recommendation for DC plan and follow-up therapies     Equipment Recommendations  None recommended by PT    Recommendations for Other Services       Precautions / Restrictions Precautions Precautions: Fall Restrictions Weight Bearing Restrictions: No    Mobility  Bed Mobility Overal bed mobility: Needs Assistance Bed Mobility: Supine to Sit     Supine to sit: HOB elevated;Min assist     General bed mobility comments: pt up in recliner  Transfers Overall transfer level: Needs assistance Equipment used: Rolling walker (2 wheeled) Transfers: Sit to/from Stand Sit to Stand: Min assist;From elevated surface         General transfer comment: cues for safe hand placement and technique with RW, assist for rise, steady and controlling descent due to pain  Ambulation/Gait Ambulation/Gait assistance: Min guard Gait Distance (Feet): 14 Feet(x2) Assistive device: Rolling walker (2 wheeled) Gait Pattern/deviations: Decreased stance time - left;Antalgic;Decreased weight shift to left;Step-to pattern     General Gait Details: verbal cues for sequence, RW positioning, pt reports increased pain (8/10) so limited distance to ambulating to/from bathroom   Stairs             Wheelchair Mobility    Modified Rankin (Stroke Patients Only)       Balance                                            Cognition Arousal/Alertness: Awake/alert Behavior During Therapy: WFL for tasks assessed/performed Overall Cognitive Status: Within Functional Limits for tasks assessed                                        Exercises Total Joint Exercises Ankle Circles/Pumps: AROM;10 reps;Both Quad Sets: AROM;Left;10 reps Heel Slides: AAROM;10 reps;Left Hip ABduction/ADduction: AAROM;Left;10 reps Long Arc Quad: AROM;Seated;Left;10 reps    General Comments        Pertinent Vitals/Pain Pain Assessment: 0-10 Pain Score: 8  Pain Location: LT hip Pain Descriptors / Indicators: Aching;Sore Pain Intervention(s): Repositioned;Monitored during session;Premedicated before session    Home Living                      Prior Function            PT Goals (current goals can now be found in the care plan section) Progress towards PT goals: Progressing toward goals    Frequency    7X/week      PT Plan Current plan remains appropriate    Co-evaluation              AM-PAC PT "6 Clicks" Mobility   Outcome Measure  Help needed turning from your back to your side while  in a flat bed without using bedrails?: A Little Help needed moving from lying on your back to sitting on the side of a flat bed without using bedrails?: A Little Help needed moving to and from a bed to a chair (including a wheelchair)?: A Little Help needed standing up from a chair using your arms (e.g., wheelchair or bedside chair)?: A Little Help needed to walk in hospital room?: A Lot Help needed climbing 3-5 steps with a railing? : A Lot 6 Click Score: 16    End of Session Equipment Utilized During Treatment: Gait belt Activity Tolerance: Patient limited by pain Patient left: in chair;with call bell/phone within reach;with chair alarm set Nurse Communication: Mobility status PT Visit Diagnosis: Muscle weakness (generalized) (M62.81);Difficulty in walking, not elsewhere  classified (R26.2)     Time: 8366-2947 PT Time Calculation (min) (ACUTE ONLY): 28 min  Charges:  $Gait Training: 8-22 mins $Therapeutic Exercise: 8-22 mins          [name], PT, DPT Acute Rehabilitation Services Office: [phone] Pager: [phone]   Robyn Smith 11/20/2018, 2:54 PM

## 2018-11-20 NOTE — TOC Progression Note (Signed)
Transition of Care [facility]) - Progression Note    Patient Details  Name: Robyn Smith MRN: [id number] Date of Birth: 02/04/1955  Transition of [facility]) CM/SW [place], LCSW Phone Number: 11/20/2018, 11:01 AM  Clinical Narrative:    HEP RW and 3 IN 1 delivered by [employer].         Expected Discharge Plan and Services           Expected Discharge Date: 11/20/18               DME Arranged: Gilford Rile rolling, 3-N-1 DME Agency: [employer] Date DME Agency Contacted: 11/20/18 Time DME Agency Contacted: 0900 Representative spoke with at DME Agency: [place] Determinants of Health ([place]) Interventions    Readmission Risk Interventions No flowsheet data found.

## 2018-11-20 NOTE — Progress Notes (Addendum)
Physical Therapy Treatment Patient Details Name: Robyn Smith MRN: [id number] DOB: [birth year] Today's Date: 11/20/2018    History of Present Illness Patient is 63 y.o. female s/p Lt THA anterior approach on 11/19/18 with PMH significant for PVD, OA, GERD,fibromyalgia, lumbar fusion L2-5, and Rt THA.    PT Comments    Pt attempted ambulation however became very dizzy and required recliner.  RN notified.  Will check back for afternoon session.    Follow Up Recommendations  Follow surgeon's recommendation for DC plan and follow-up therapies     Equipment Recommendations  None recommended by PT    Recommendations for Other Services       Precautions / Restrictions Precautions Precautions: Fall Restrictions Weight Bearing Restrictions: No    Mobility  Bed Mobility Overal bed mobility: Needs Assistance Bed Mobility: Supine to Sit     Supine to sit: HOB elevated;Min assist     General bed mobility comments: assist for left LE  Transfers Overall transfer level: Needs assistance Equipment used: Rolling walker (2 wheeled) Transfers: Sit to/from Stand Sit to Stand: Min assist;From elevated surface         General transfer comment: cues for safe hand placement and technique with RW, assist for rise, steady and controlling descent due to pain  Ambulation/Gait Ambulation/Gait assistance: Min assist Gait Distance (Feet): 8 Feet Assistive device: Rolling walker (2 wheeled) Gait Pattern/deviations: Decreased stance time - left;Antalgic;Decreased weight shift to left;Step-to pattern     General Gait Details: verbal cues for sequence, RW positioning, pt reported dizziness which worsened so recliner brought for pt and pt assisted into recliner (vitals in docflowsheets however BP low) RN notified BP: 101/47 mmHg   Stairs             Wheelchair Mobility    Modified Rankin (Stroke Patients Only)       Balance                                             Cognition Arousal/Alertness: Awake/alert Behavior During Therapy: WFL for tasks assessed/performed Overall Cognitive Status: Within Functional Limits for tasks assessed                                        Exercises      General Comments        Pertinent Vitals/Pain Pain Assessment: 0-10 Pain Score: 7  Pain Location: LT hip Pain Descriptors / Indicators: Aching;Sore Pain Intervention(s): Repositioned;Monitored during session;Premedicated before session    Home Living                      Prior Function            PT Goals (current goals can now be found in the care plan section) Progress towards PT goals: Progressing toward goals    Frequency    7X/week      PT Plan Current plan remains appropriate    Co-evaluation              AM-PAC PT "6 Clicks" Mobility   Outcome Measure  Help needed turning from your back to your side while in a flat bed without using bedrails?: A Little Help needed moving from lying on your back to sitting on the side of  a flat bed without using bedrails?: A Little Help needed moving to and from a bed to a chair (including a wheelchair)?: A Little Help needed standing up from a chair using your arms (e.g., wheelchair or bedside chair)?: A Little Help needed to walk in hospital room?: A Lot Help needed climbing 3-5 steps with a railing? : A Lot 6 Click Score: 16    End of Session Equipment Utilized During Treatment: Gait belt Activity Tolerance: Patient tolerated treatment well Patient left: in chair;with call bell/phone within reach;with chair alarm set Nurse Communication: Mobility status PT Visit Diagnosis: Muscle weakness (generalized) (M62.81);Difficulty in walking, not elsewhere classified (R26.2)     Time: 1000-1015 PT Time Calculation (min) (ACUTE ONLY): 15 min  Charges:  $Gait Training: 8-22 mins                     [name], PT, DPT Acute Rehabilitation  Services Office: [phone] Pager: [phone]  Sarajane Jews 11/20/2018, 2:49 PM

## 2018-11-21 ENCOUNTER — Encounter (HOSPITAL_COMMUNITY): Payer: Self-pay | Admitting: Orthopedic Surgery

## 2018-11-21 LAB — CBC
HCT: 25.7 % — ABNORMAL LOW (ref 36.0–46.0)
Hemoglobin: 7.9 g/dL — ABNORMAL LOW (ref 12.0–15.0)
MCH: 30.7 pg (ref 26.0–34.0)
MCHC: 30.7 g/dL (ref 30.0–36.0)
MCV: 100 fL (ref 80.0–100.0)
Platelets: 181 10*3/uL (ref 150–400)
RBC: 2.57 MIL/uL — ABNORMAL LOW (ref 3.87–5.11)
RDW: 13.4 % (ref 11.5–15.5)
WBC: 9.5 10*3/uL (ref 4.0–10.5)
nRBC: 0 % (ref 0.0–0.2)

## 2018-11-21 LAB — BASIC METABOLIC PANEL
Anion gap: 7 (ref 5–15)
BUN: 10 mg/dL (ref 8–23)
CO2: 24 mmol/L (ref 22–32)
Calcium: 7.8 mg/dL — ABNORMAL LOW (ref 8.9–10.3)
Chloride: 102 mmol/L (ref 98–111)
Creatinine, Ser: 0.66 mg/dL (ref 0.44–1.00)
GFR calc Af Amer: >60 mL/min (ref >60)
GFR calc non Af Amer: >60 mL/min (ref >60)
Glucose, Bld: 154 mg/dL — ABNORMAL HIGH (ref 70–99)
Potassium: 3.9 mmol/L (ref 3.5–5.1)
Sodium: 133 mmol/L — ABNORMAL LOW (ref 135–145)

## 2018-11-21 NOTE — Progress Notes (Signed)
Patient ID: Robyn Smith, female   DOB: 08-28-1955, 63 y.o.   MRN: [id number] Subjective: 2 Days Post-Op Procedure(s) (LRB): TOTAL HIP ARTHROPLASTY ANTERIOR APPROACH (Left)    Patient reports pain as moderate.  Dizziness with therapy yesterday but not as much this am when up to use restroom  Objective:   VITALS:   Vitals:   11/20/18 2030 11/21/18 0523  BP: 125/67 130/61  Pulse: 77 87  Resp: 16 16  Temp: 98.9 F (37.2 C) 99.5 F (37.5 C)  SpO2: 96% 96%    Neurovascular intact Incision: dressing C/D/I - left hip  LABS Recent Labs    11/20/18 0201 11/21/18 0218  HGB 8.6* 7.9*  HCT 27.6* 25.7*  WBC 10.7* 9.5  PLT 214 181    Recent Labs    11/20/18 0201 11/21/18 0218  NA 139 133*  K 4.2 3.9  BUN 13 10  CREATININE 0.66 0.66  GLUCOSE 138* 154*    No results for input(s): LABPT, INR in the last 72 hours.   Assessment/Plan: 2 Days Post-Op Procedure(s) (LRB): TOTAL HIP ARTHROPLASTY ANTERIOR APPROACH (Left)   Up with therapy  Plan to try and go home today after a couple sessions of therapy if she does well without dizziness IRON supplement for 3-4 weeks Hydration RTC in 2 weeks

## 2018-11-21 NOTE — Progress Notes (Signed)
Physical Therapy Treatment Patient Details Name: Robyn Smith MRN: [id number] DOB: 01/08/1956 Today's Date: 11/21/2018    History of Present Illness Patient is 63 y.o. female s/p Lt THA anterior approach on 11/19/18 with PMH significant for PVD, OA, GERD,fibromyalgia, lumbar fusion L2-5, and Rt THA.    PT Comments    Pt assisted with ambulating in hallway and performed standing exercises.  Pt able to verbally recall exercises from yesterday and provided with HEP handout.  Pt also provided with stair handout.  Pt had no further questions and is ready for d/c home today.   Follow Up Recommendations  Follow surgeon's recommendation for DC plan and follow-up therapies     Equipment Recommendations  None recommended by PT    Recommendations for Other Services       Precautions / Restrictions Precautions Precautions: Fall Restrictions Weight Bearing Restrictions: No    Mobility  Bed Mobility Overal bed mobility: Needs Assistance Bed Mobility: Supine to Sit     Supine to sit: Min guard;HOB elevated     General bed mobility comments: pt in recliner  Transfers Overall transfer level: Needs assistance Equipment used: Rolling walker (2 wheeled) Transfers: Sit to/from Stand Sit to Stand: Min guard         General transfer comment: cues for safe hand placement and bringing L LE forward for pain control  Ambulation/Gait Ambulation/Gait assistance: Min guard Gait Distance (Feet): 50 Feet Assistive device: Rolling walker (2 wheeled) Gait Pattern/deviations: Decreased stance time - left;Antalgic;Decreased weight shift to left;Step-to pattern Gait velocity: decr   General Gait Details: verbal cues for sequence, RW positioning, step length, posture   Stairs Stairs: Yes Stairs assistance: Min guard Stair Management: Step to pattern;Backwards;With walker Number of Stairs: 3 General stair comments: verbal cues for sequence, RW positioning, safety; pt reports  understanding   Wheelchair Mobility    Modified Rankin (Stroke Patients Only)       Balance                                            Cognition Arousal/Alertness: Awake/alert Behavior During Therapy: WFL for tasks assessed/performed Overall Cognitive Status: Within Functional Limits for tasks assessed                                        Exercises Total Joint Exercises Hip ABduction/ADduction: AROM;Standing;Left;10 reps Knee Flexion: AROM;Left;10 reps;Standing Marching in Standing: AROM;Left;Standing;10 reps Standing Hip Extension: AROM;Left;10 reps;Standing    General Comments        Pertinent Vitals/Pain Pain Assessment: 0-10 Pain Score: 7  Pain Location: LT hip and knee Pain Descriptors / Indicators: Aching;Sore Pain Intervention(s): Monitored during session;Limited activity within patient's tolerance;Repositioned;Ice applied    Home Living                      Prior Function            PT Goals (current goals can now be found in the care plan section) Progress towards PT goals: Progressing toward goals    Frequency    7X/week      PT Plan Current plan remains appropriate    Co-evaluation              AM-PAC PT "6 Clicks" Mobility   Outcome  Measure  Help needed turning from your back to your side while in a flat bed without using bedrails?: A Little Help needed moving from lying on your back to sitting on the side of a flat bed without using bedrails?: A Little Help needed moving to and from a bed to a chair (including a wheelchair)?: A Little Help needed standing up from a chair using your arms (e.g., wheelchair or bedside chair)?: A Little Help needed to walk in hospital room?: A Little Help needed climbing 3-5 steps with a railing? : A Little 6 Click Score: 18    End of Session Equipment Utilized During Treatment: Gait belt Activity Tolerance: Patient tolerated treatment well Patient  left: in chair;with call bell/phone within reach Nurse Communication: Mobility status PT Visit Diagnosis: Muscle weakness (generalized) (M62.81);Difficulty in walking, not elsewhere classified (R26.2)     Time: 1325-1350 PT Time Calculation (min) (ACUTE ONLY): 25 min  Charges:  $Gait Training: 8-22 mins $Therapeutic Exercise: 8-22 mins                     [name], PT, DPT Acute Rehabilitation Services Office: [phone] Pager: [phone]  Sarajane Jews 11/21/2018, 2:59 PM

## 2018-11-21 NOTE — Progress Notes (Signed)
Physical Therapy Treatment Patient Details Name: Robyn Smith MRN: [id number] DOB: Apr 15, 1955 Today's Date: 11/21/2018    History of Present Illness Patient is 63 y.o. female s/p Lt THA anterior approach on 11/19/18 with PMH significant for PVD, OA, GERD,fibromyalgia, lumbar fusion L2-5, and Rt THA.    PT Comments    Pt assisted with ambulating and practicing stairs.  Pt reports hip and knee pain limiting mobility however feels a little better today.  Will return for afternoon session prior to d/c.   Follow Up Recommendations  Follow surgeon's recommendation for DC plan and follow-up therapies     Equipment Recommendations  None recommended by PT    Recommendations for Other Services       Precautions / Restrictions Precautions Precautions: Fall Restrictions Weight Bearing Restrictions: No    Mobility  Bed Mobility Overal bed mobility: Needs Assistance Bed Mobility: Supine to Sit     Supine to sit: Min guard;HOB elevated     General bed mobility comments: slow and effortful; pt self assists left LE  Transfers Overall transfer level: Needs assistance Equipment used: Rolling walker (2 wheeled) Transfers: Sit to/from Stand Sit to Stand: From elevated surface;Min guard         General transfer comment: cues for safe hand placement and technique with RW  Ambulation/Gait Ambulation/Gait assistance: Min guard Gait Distance (Feet): 60 Feet Assistive device: Rolling walker (2 wheeled) Gait Pattern/deviations: Decreased stance time - left;Antalgic;Decreased weight shift to left;Step-to pattern Gait velocity: decr   General Gait Details: verbal cues for sequence, RW positioning, step length, posture   Stairs Stairs: Yes Stairs assistance: Min guard Stair Management: Step to pattern;Backwards;With walker Number of Stairs: 3 General stair comments: verbal cues for sequence, RW positioning, safety; pt reports understanding   Wheelchair Mobility    Modified  Rankin (Stroke Patients Only)       Balance                                            Cognition Arousal/Alertness: Awake/alert Behavior During Therapy: WFL for tasks assessed/performed Overall Cognitive Status: Within Functional Limits for tasks assessed                                        Exercises      General Comments        Pertinent Vitals/Pain Pain Assessment: 0-10 Pain Score: 7  Pain Location: LT hip and knee Pain Descriptors / Indicators: Aching;Sore Pain Intervention(s): Monitored during session;Repositioned    Home Living                      Prior Function            PT Goals (current goals can now be found in the care plan section) Progress towards PT goals: Progressing toward goals    Frequency    7X/week      PT Plan Current plan remains appropriate    Co-evaluation              AM-PAC PT "6 Clicks" Mobility   Outcome Measure  Help needed turning from your back to your side while in a flat bed without using bedrails?: A Little Help needed moving from lying on your back to sitting on the side  of a flat bed without using bedrails?: A Little Help needed moving to and from a bed to a chair (including a wheelchair)?: A Little Help needed standing up from a chair using your arms (e.g., wheelchair or bedside chair)?: A Little Help needed to walk in hospital room?: A Little Help needed climbing 3-5 steps with a railing? : A Little 6 Click Score: 18    End of Session Equipment Utilized During Treatment: Gait belt Activity Tolerance: Patient tolerated treatment well Patient left: in chair;with call bell/phone within reach Nurse Communication: Mobility status PT Visit Diagnosis: Muscle weakness (generalized) (M62.81);Difficulty in walking, not elsewhere classified (R26.2)     Time: 2229-7989 PT Time Calculation (min) (ACUTE ONLY): 21 min  Charges:  $Gait Training: 8-22 mins                      Carmelia Bake, PT, DPT Acute Rehabilitation Services Office: [phone] Pager: [phone]  Trena Platt 11/21/2018, 1:00 PM

## 2018-11-26 NOTE — Discharge Summary (Signed)
Physician Discharge Summary  Patient ID: Robyn Smith MRN: [id number] DOB/AGE: [birth year] 63 y.o.  Admit date: 11/19/2018 Discharge date: 11/21/2018   Procedures:  Procedure(s) (LRB): TOTAL HIP ARTHROPLASTY ANTERIOR APPROACH (Left)  Attending Physician:  Dr. Paralee Cancel   Admission Diagnoses:   Left hip primary OA / pain  Discharge Diagnoses:  Active Problems:   S/P left THA, AA   Status post total hip replacement, left   Overweight (BMI 25.0-29.9)  Past Medical History:  Diagnosis Date  . Anemia    hx of, none since menapause  . Anxiety   . Arthritis   . Burning mouth syndrome   . Fibromyalgia   . GERD (gastroesophageal reflux disease)    mild  . Peripheral vascular disease ([place])   . Pneumonia    as a child  . PONV (postoperative nausea and vomiting)    "some" not too much  . Thin skin    on arms  . Venous reflux    surgery in oct and nov 2016    HPI:    Robyn Smith, 63 y.o. female, has a history of pain and functional disability in the left hip(s) due to arthritis and patient has failed non-surgical conservative treatments for greater than 12 weeks to include NSAID's and/or analgesics and activity modification.  Onset of symptoms was gradual starting 1 years ago with gradually worsening course since that time.The patient noted prior procedures of the hip to include arthroplasty on the right hip(s).  Patient currently rates pain in the left hip at 9 out of 10 with activity. Patient has night pain, worsening of pain with activity and weight bearing, trendelenberg gait, pain that interfers with activities of daily living and pain with passive range of motion. Patient has evidence of periarticular osteophytes and joint space narrowing by imaging studies. This condition presents safety issues increasing the risk of falls.There is no current active infection.  Risks, benefits and expectations were discussed with the patient.  Risks including but not limited to the risk  of anesthesia, blood clots, nerve damage, blood vessel damage, failure of the prosthesis, infection and up to and including death.  Patient understand the risks, benefits and expectations and wishes to proceed with surgery.  PCP: Esperanza Sheets, FNP   Discharged Condition: good  Hospital Course:  Patient underwent the above stated procedure on 11/19/2018. Patient tolerated the procedure well and brought to the recovery room in good condition and subsequently to the floor.  POD #1 BP: 110/50 ; Pulse: 54 ; Temp: 98.1 F (36.7 C) ; Resp: 14 Patient reports pain as moderate, pain for the most part is controlled with medication.  No reported events throughout the night.  We have discussed the procedure, findings and expectations moving forward. Dorsiflexion/plantar flexion intact, incision: dressing C/D/I, no cellulitis present and compartment soft.   LABS  Basename    HGB     8.6  HCT     27.6   POD #2  BP: 130/61 ; Pulse: 87 ; Temp: 99.5 F (37.5 C) ; Resp: 16 Patient reports pain as moderate.  Dizziness with therapy yesterday but not as much this am when up to use restroom. Neurovascular intact and incision: dressing C/D/I.   LABS  Basename    HGB     7.9  HCT     25.7    Discharge Exam: General appearance: alert, cooperative and no distress Extremities: Homans sign is negative, no sign of DVT, no edema, redness or tenderness  in the calves or thighs and no ulcers, gangrene or trophic changes  Disposition:  Home with follow up in 2 weeks   Follow-up Information    Durene Romanslin, Fern Asmar, MD. Schedule an appointment as soon as possible for a visit in 2 weeks.   Specialty: Orthopedic Surgery Contact information: [address] [place] [address] [phone]           Discharge Instructions    Call MD / Call 911   Complete by: As directed    If you experience chest pain or shortness of breath, CALL 911 and be transported to the hospital emergency room.   If you develope a fever above 101 F, pus (white drainage) or increased drainage or redness at the wound, or calf pain, call your surgeon's office.   Change dressing   Complete by: As directed    Maintain surgical dressing until follow up in the clinic. If the edges start to pull up, may reinforce with tape. If the dressing is no longer working, may remove and cover with gauze and tape, but must keep the area dry and clean.  Call with any questions or concerns.   Constipation Prevention   Complete by: As directed    Drink plenty of fluids.  Prune juice may be helpful.  You may use a stool softener, such as Colace (over the counter) 100 mg twice a day.  Use MiraLax (over the counter) for constipation as needed.   Diet - low sodium heart healthy   Complete by: As directed    Discharge instructions   Complete by: As directed    Maintain surgical dressing until follow up in the clinic. If the edges start to pull up, may reinforce with tape. If the dressing is no longer working, may remove and cover with gauze and tape, but must keep the area dry and clean.  [facility]. Call with any questions or concerns.   Increase activity slowly as tolerated   Complete by: As directed    Weight bearing as tolerated with assist device (walker, cane, etc) as directed, use it as long as suggested by your surgeon or therapist, typically at least 4-6 weeks.   TED hose   Complete by: As directed    Use stockings (TED hose) for 2 weeks on both leg(s).  You may remove them at night for sleeping.      Allergies as of 11/21/2018      Reactions   Penicillins Rash   Did it involve swelling of the face/tongue/throat, SOB, or low BP? No Did it involve sudden or severe rash/hives, skin peeling, or any reaction on the inside of your mouth or nose? No Did you need to seek medical attention at a hospital or doctor's office? No When did it last happen?Childhood allergy If all above  answers are "NO", may proceed with cephalosporin use.      Medication List    STOP taking these medications   naproxen sodium 220 MG tablet Commonly known as: ALEVE   oxyCODONE-acetaminophen 10-325 MG tablet Commonly known as: PERCOCET     TAKE these medications   acetaminophen 500 MG tablet Commonly known as: TYLENOL Take 2 tablets (1,000 mg total) by mouth every 8 (eight) hours.   aspirin 81 MG chewable tablet Commonly known as: Aspirin Childrens Chew 1 tablet (81 mg total) by mouth 2 (two) times daily. Take for 4 weeks, then resume regular dose.   docusate sodium 100  MG capsule Commonly known as: Colace Take 1 capsule (100 mg total) by mouth 2 (two) times daily.   escitalopram 10 MG tablet Commonly known as: LEXAPRO Take 10 mg by mouth every evening.   ferrous sulfate 325 (65 FE) MG tablet Commonly known as: FerrouSul Take 1 tablet (325 mg total) by mouth 3 (three) times daily with meals for 14 days.   lidocaine 2 % solution Commonly known as: XYLOCAINE Use as directed 5 mLs in the mouth or throat every 6 (six) hours as needed for mouth pain.   methocarbamol 500 MG tablet Commonly known as: Robaxin Take 1 tablet (500 mg total) by mouth every 6 (six) hours as needed for muscle spasms.   Oxycodone HCl 10 MG Tabs Take 1-2 tablets (10-20 mg total) by mouth every 4 (four) hours as needed for moderate pain or severe pain.   polyethylene glycol 17 g packet Commonly known as: MIRALAX / GLYCOLAX Take 17 g by mouth 2 (two) times daily.   psyllium 58.6 % packet Commonly known as: METAMUCIL Take 1 packet by mouth daily as needed (constipation).   vitamin C 500 MG tablet Commonly known as: ASCORBIC ACID Take 500 mg by mouth daily.            Discharge Care Instructions  (From admission, onward)         Start     Ordered   11/20/18 0000  Change dressing    Comments: Maintain surgical dressing until follow up in the clinic. If the edges start to pull up, may  reinforce with tape. If the dressing is no longer working, may remove and cover with gauze and tape, but must keep the area dry and clean.  Call with any questions or concerns.   11/20/18 2992           Signed: Anastasio Auerbach. Merinda Victorino   PA-C  11/26/2018, 8:10 AM

## 2019-11-19 ENCOUNTER — Ambulatory Visit: Payer: Medicare Other | Admitting: Podiatry

## 2019-12-03 ENCOUNTER — Ambulatory Visit: Payer: Medicare Other | Admitting: Podiatry

## 2019-12-08 ENCOUNTER — Ambulatory Visit (INDEPENDENT_AMBULATORY_CARE_PROVIDER_SITE_OTHER): Payer: Medicare Other

## 2019-12-08 ENCOUNTER — Other Ambulatory Visit: Payer: Self-pay

## 2019-12-08 ENCOUNTER — Ambulatory Visit: Payer: Medicare Other | Admitting: Podiatry

## 2019-12-08 DIAGNOSIS — M779 Enthesopathy, unspecified: Secondary | ICD-10-CM | POA: Diagnosis not present

## 2019-12-08 DIAGNOSIS — M79672 Pain in left foot: Secondary | ICD-10-CM | POA: Diagnosis not present

## 2019-12-08 DIAGNOSIS — M79671 Pain in right foot: Secondary | ICD-10-CM

## 2019-12-08 DIAGNOSIS — L600 Ingrowing nail: Secondary | ICD-10-CM | POA: Diagnosis not present

## 2019-12-08 MED ORDER — TRIAMCINOLONE ACETONIDE 10 MG/ML IJ SUSP
10.0000 mg | Freq: Once | INTRAMUSCULAR | Status: AC
  Administered 2019-12-08: 10 mg

## 2019-12-08 NOTE — Patient Instructions (Signed)

## 2019-12-10 NOTE — Progress Notes (Signed)
Subjective:   Patient ID: Robyn Smith, female   DOB: 64 y.o.   MRN: [id number]   HPI Patient presents stating she has had chronic pain in her left forefoot and has digital deformities and has thick nail deformities with the second right being very tender and hard to wear shoe gear with.  Patient states she has tried trimming and other techniques without relief of symptoms and patient does not smoke likes to be active   Review of Systems  All other systems reviewed and are negative.       Objective:  Physical Exam Vitals and nursing note reviewed.  Constitutional:      Appearance: She is well-developed.  Pulmonary:     Effort: Pulmonary effort is normal.  Musculoskeletal:        General: Normal range of motion.  Skin:    General: Skin is warm.  Neurological:     Mental Status: She is alert.     Neurovascular status found to be intact muscle strength was found to be adequate range of motion adequate subtalar midtarsal joint.  Patient has forefoot derangement with significant swelling and thickness around the second MPJ left and has a damaged deformed second nail right that is painful when pressed.  Patient has moderate discomfort in general in both feet and has good digital perfusion well oriented x3     Assessment:  Damaged thickened second nail right with pain along with inflammatory capsulitis of the second MPJ left sore when pressed     Plan:  H&P reviewed all conditions and generalized foot pain.  Reviewed x-rays and today went ahead discussed removal of the second nail due to the thickness and pain and she wants this done explaining that the digital contracture is also part of the pathology.  For that particular problem I allowed her to read and signed consent form understanding risk and I infiltrated the right second toe 60 mg like Marcaine mixture sterile prep done and using sterile instrumentation remove the nail exposed matrix applied phenol 5 applications 30 seconds  followed by alcohol lavage and sterile dressing.  Gave instructions on soaks for that and to leave dressing on 24 hours but take it off earlier if any pathology were to occur.  For the left I did inject around the second MPJ 3 mg Kenalog 5 mg Xylocaine to reduce inflammation explained arthritis and the consideration long-term for implant or other procedure that could be done.  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

## 2019-12-16 ENCOUNTER — Other Ambulatory Visit: Payer: Self-pay | Admitting: Podiatry

## 2019-12-16 ENCOUNTER — Telehealth: Payer: Self-pay | Admitting: *Deleted

## 2019-12-16 MED ORDER — DOXYCYCLINE HYCLATE 100 MG PO TABS: 100.0000 mg | ORAL_TABLET | Freq: Two times a day (BID) | ORAL | 0 refills | Status: AC

## 2019-12-16 NOTE — Telephone Encounter (Signed)
Blister came up on 12/1, they popped and looks like it healing but the toe is swollen. There is no red streaking but has a bruise on the toe. Sent antibiotics to the pharmacy and recommended soaking in epsom salts.   Robyn Smith- can you please schedule her a follow up? Thanks.

## 2019-12-16 NOTE — Telephone Encounter (Signed)
Patient's right foot , 2nd toe has formed blisters 2 days after toenail removal 2 weeks ago. The toe is now swollen, blisters have popped and toe hurts a little w/ movement,skin is raw.Please advise.

## 2019-12-17 NOTE — Telephone Encounter (Signed)
Called pt to schedule a follow up visit, she will continue the antibiotics and give [place] a call in a few days if it doesn't get better to schedule

## 2019-12-24 ENCOUNTER — Other Ambulatory Visit: Payer: Self-pay

## 2019-12-24 ENCOUNTER — Encounter: Payer: Self-pay | Admitting: Podiatry

## 2019-12-24 ENCOUNTER — Ambulatory Visit: Payer: Medicare Other | Admitting: Podiatry

## 2019-12-24 DIAGNOSIS — L97512 Non-pressure chronic ulcer of other part of right foot with fat layer exposed: Secondary | ICD-10-CM | POA: Diagnosis not present

## 2019-12-24 DIAGNOSIS — L600 Ingrowing nail: Secondary | ICD-10-CM

## 2019-12-24 NOTE — Progress Notes (Signed)
Subjective:  Patient ID: Robyn Smith, female    DOB: 19-Jun-1955,  MRN: [id number]  Chief Complaint  Patient presents with  . Nail Problem    Right foot 2nd toe infection from previous nail removal. PT stated that 2 weeks ago she had a nail removed and the next day she got a blister and now her toe is infected. She was called in a prescription for doxycyline.     64 y.o. female presents for wound care.  Patient presents with right second digit wound with fat layer exposed.  Patient states that this started after getting the ingrown removed.  Patient had nail removed by Dr. Charlsie Merles.  Patient has already been placed on doxycycline and she has not completed her course but will complete it.  I instructed her the importance of completing the course.  She states that she got a blister and have the top of the toe and it got worse since then.  She denies any other acute complaints.  She states is very painful to touch.  She has not seen anyone else prior to see me for this particular complication after doing a nail avulsion   Review of Systems: Negative except as noted in the HPI. Denies N/V/F/Ch.  Past Medical History:  Diagnosis Date  . Anemia    hx of, none since menapause  . Anxiety   . Arthritis   . Burning mouth syndrome   . Fibromyalgia   . GERD (gastroesophageal reflux disease)    mild  . Peripheral vascular disease (HCC)   . Pneumonia    as a child  . PONV (postoperative nausea and vomiting)    "some" not too much  . Thin skin    on arms  . Venous reflux    surgery in oct and nov 2016    Current Outpatient Medications:  .  acetaminophen (TYLENOL) 500 MG tablet, Take 2 tablets (1,000 mg total) by mouth every 8 (eight) hours., Disp: 30 tablet, Rfl: 0 .  docusate sodium (COLACE) 100 MG capsule, Take 1 capsule (100 mg total) by mouth 2 (two) times daily., Disp: 28 capsule, Rfl: 0 .  doxycycline (VIBRA-TABS) 100 MG tablet, Take 1 tablet (100 mg total) by mouth 2 (two) times daily.,  Disp: 20 tablet, Rfl: 0 .  escitalopram (LEXAPRO) 10 MG tablet, Take 10 mg by mouth every evening., Disp: , Rfl:  .  ferrous sulfate (FERROUSUL) 325 (65 FE) MG tablet, Take 1 tablet (325 mg total) by mouth 3 (three) times daily with meals for 14 days., Disp: 42 tablet, Rfl: 0 .  lidocaine (XYLOCAINE) 2 % solution, Use as directed 5 mLs in the mouth or throat every 6 (six) hours as needed for mouth pain. , Disp: , Rfl:  .  methocarbamol (ROBAXIN) 500 MG tablet, Take 1 tablet (500 mg total) by mouth every 6 (six) hours as needed for muscle spasms., Disp: 40 tablet, Rfl: 0 .  oxyCODONE 10 MG TABS, Take 1-2 tablets (10-20 mg total) by mouth every 4 (four) hours as needed for moderate pain or severe pain., Disp: 60 tablet, Rfl: 0 .  polyethylene glycol (MIRALAX / GLYCOLAX) 17 g packet, Take 17 g by mouth 2 (two) times daily., Disp: 28 packet, Rfl: 0 .  psyllium (METAMUCIL) 58.6 % packet, Take 1 packet by mouth daily as needed (constipation)., Disp: , Rfl:  .  vitamin C (ASCORBIC ACID) 500 MG tablet, Take 500 mg by mouth daily., Disp: , Rfl:   Social History  Tobacco Use  Smoking Status Never Smoker  Smokeless Tobacco Never Used    Allergies  Allergen Reactions  . Penicillins Rash    Did it involve swelling of the face/tongue/throat, SOB, or low BP? No Did it involve sudden or severe rash/hives, skin peeling, or any reaction on the inside of your mouth or nose? No Did you need to seek medical attention at a hospital or doctor's office? No When did it last happen?Childhood allergy If all above answers are "NO", may proceed with cephalosporin use.  Other reaction(s): Other (See Comments) UNSPECIFIED CHILDHOOD REACTION Has patient had a PCN reaction causing immediate rash, facial/tongue/throat swelling, SOB or lightheadedness with hypotension: Unknown Has patient had a PCN reaction causing severe rash involving mucus membranes or skin necrosis: Unknown Has patient had a PCN reaction that  required hospitalization: No Has patient had a PCN reaction occurring within the last 10 years: No Childhood reaction If all of the above answers are "NO", then may proceed with Cep   Objective:  There were no vitals filed for this visit. There is no height or weight on file to calculate BMI. Constitutional Well developed. Well nourished.  Vascular Dorsalis pedis pulses palpable bilaterally. Posterior tibial pulses palpable bilaterally. Capillary refill normal to all digits.  No cyanosis or clubbing noted. Pedal hair growth normal.  Neurologic Normal speech. Oriented to person, place, and time. Protective sensation absent  Dermatologic Wound Location: Right second digit ulceration with fat layer exposed Wound Base: Mixed Granular/Fibrotic Peri-wound: Calloused Exudate: Scant/small amount Serosanguinous exudate Wound Measurements: -See below  Orthopedic: No pain to palpation either foot.   Radiographs: None Assessment:   1. Ingrown nail   2. Ulcer of right second toe with fat layer exposed (HCC)    Plan:  Patient was evaluated and treated and all questions answered.  Ulcer right second digit distal tip ulceration with fat layer exposed status post total nail avulsion -Debridement as below. -Dressed with Betadine wet-to-dry, DSD. -Continue off-loading with surgical shoe.  Procedure: Excisional Debridement of Wound Tool: Sharp chisel blade/tissue nipper Rationale: Removal of non-viable soft tissue from the wound to promote healing.  Anesthesia: none Pre-Debridement Wound Measurements: 0.5 cm x 0.4 cm x 0.3 cm  Post-Debridement Wound Measurements: 0.6 cm x 0.5 cm x 0.3 cm  Type of Debridement: Sharp Excisional Tissue Removed: Non-viable soft tissue Blood loss: Minimal (<50cc) Depth of Debridement: subcutaneous tissue. Technique: Sharp excisional debridement to bleeding, viable wound base.  Wound Progress: This is my initial evaluation.  I will continue to monitor the  progression of the wound. Site healing conversation 7 Dressing: Dry, sterile, compression dressing. Disposition: Patient tolerated procedure well. Patient to return in 1 week for follow-up.  No follow-ups on file.

## 2020-01-06 ENCOUNTER — Other Ambulatory Visit: Payer: Self-pay | Admitting: Podiatry

## 2020-01-06 DIAGNOSIS — M779 Enthesopathy, unspecified: Secondary | ICD-10-CM

## 2020-09-01 ENCOUNTER — Other Ambulatory Visit: Payer: Self-pay

## 2020-09-01 ENCOUNTER — Ambulatory Visit: Payer: Medicare Other | Admitting: Podiatry

## 2020-09-01 ENCOUNTER — Encounter: Payer: Self-pay | Admitting: Podiatry

## 2020-09-01 ENCOUNTER — Ambulatory Visit (INDEPENDENT_AMBULATORY_CARE_PROVIDER_SITE_OTHER): Payer: Medicare Other

## 2020-09-01 DIAGNOSIS — M79672 Pain in left foot: Secondary | ICD-10-CM

## 2020-09-01 DIAGNOSIS — M2041 Other hammer toe(s) (acquired), right foot: Secondary | ICD-10-CM

## 2020-09-01 DIAGNOSIS — G629 Polyneuropathy, unspecified: Secondary | ICD-10-CM | POA: Diagnosis not present

## 2020-09-01 DIAGNOSIS — M79671 Pain in right foot: Secondary | ICD-10-CM

## 2020-09-01 DIAGNOSIS — M2042 Other hammer toe(s) (acquired), left foot: Secondary | ICD-10-CM | POA: Diagnosis not present

## 2020-09-01 DIAGNOSIS — M778 Other enthesopathies, not elsewhere classified: Secondary | ICD-10-CM | POA: Diagnosis not present

## 2020-09-01 MED ORDER — GABAPENTIN 300 MG PO CAPS: 300.0000 mg | ORAL_CAPSULE | Freq: Three times a day (TID) | ORAL | 3 refills | Status: AC

## 2020-09-01 MED ORDER — TRIAMCINOLONE ACETONIDE 10 MG/ML IJ SUSP
10.0000 mg | Freq: Once | INTRAMUSCULAR | Status: AC
  Administered 2020-09-01: 10 mg

## 2020-09-01 NOTE — Progress Notes (Signed)
Subjective:   Patient ID: Robyn Smith, female   DOB: 65 y.o.   MRN: [id number]   HPI Patient states the second joint left has started to hurt her again and it did well for a number of months after I treated it and she is developing some tingling in her feet which seems to gradually be getting worse and a digital deformity second right with distal keratotic tissue formation   ROS      Objective:  Physical Exam  Neurovascular status intact with inflammation pain around the second MPJ left with narrowness of the joint and fullness of the bone structure along with rigid digital contracture digit to right with distal keratotic lesion and also sides of neuropathic disease with tingling burning of her feet bilateral     Assessment:  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

## 2020-09-03 ENCOUNTER — Other Ambulatory Visit: Payer: Self-pay | Admitting: Podiatry

## 2020-09-03 DIAGNOSIS — M2041 Other hammer toe(s) (acquired), right foot: Secondary | ICD-10-CM

## 2020-09-03 DIAGNOSIS — M2042 Other hammer toe(s) (acquired), left foot: Secondary | ICD-10-CM

## 2021-05-08 IMAGING — RF DG HIP (WITH PELVIS) OPERATIVE*L*
1 series · 2 of 2 positions shown · non-contrast
Comparison: None.

CLINICAL DATA: 63-year-old female with left hip arthroplasty.

EXAM:
OPERATIVE left HIP (WITH PELVIS IF PERFORMED) 1 VIEW
TECHNIQUE: Fluoroscopic spot image(s) were submitted for interpretation
post-operatively.

[Series 1: unknown protocol · 0.20mm/px · 2 of 2 slices shown]
[im 1/2]
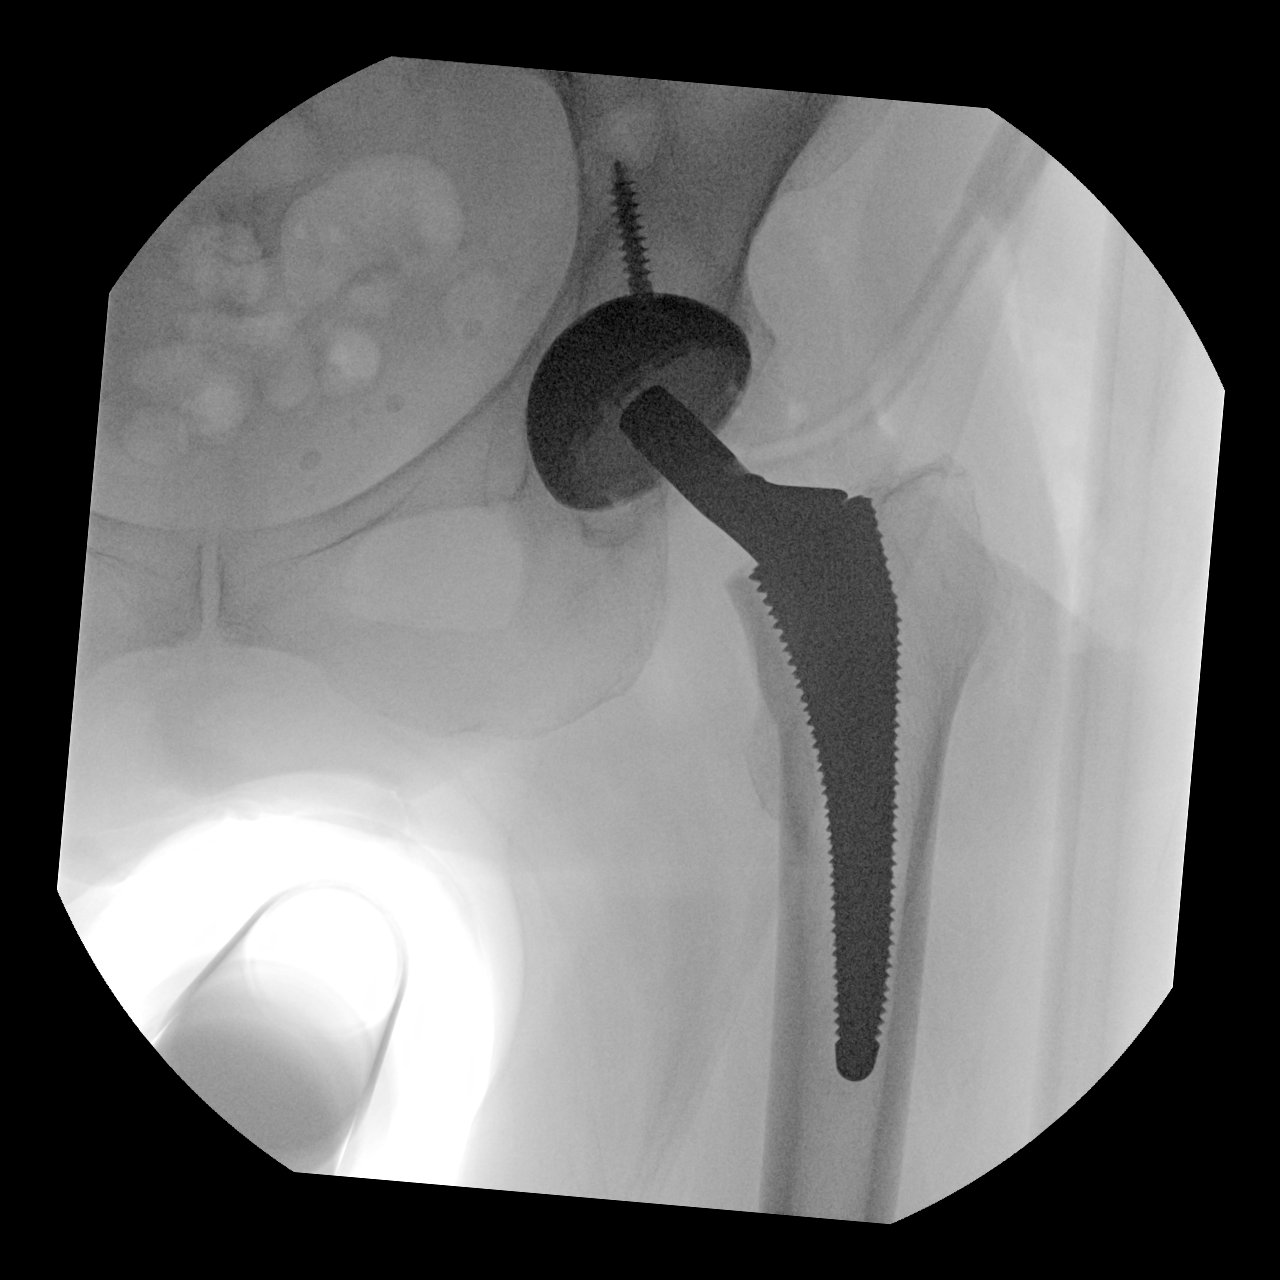
[im 2/2]
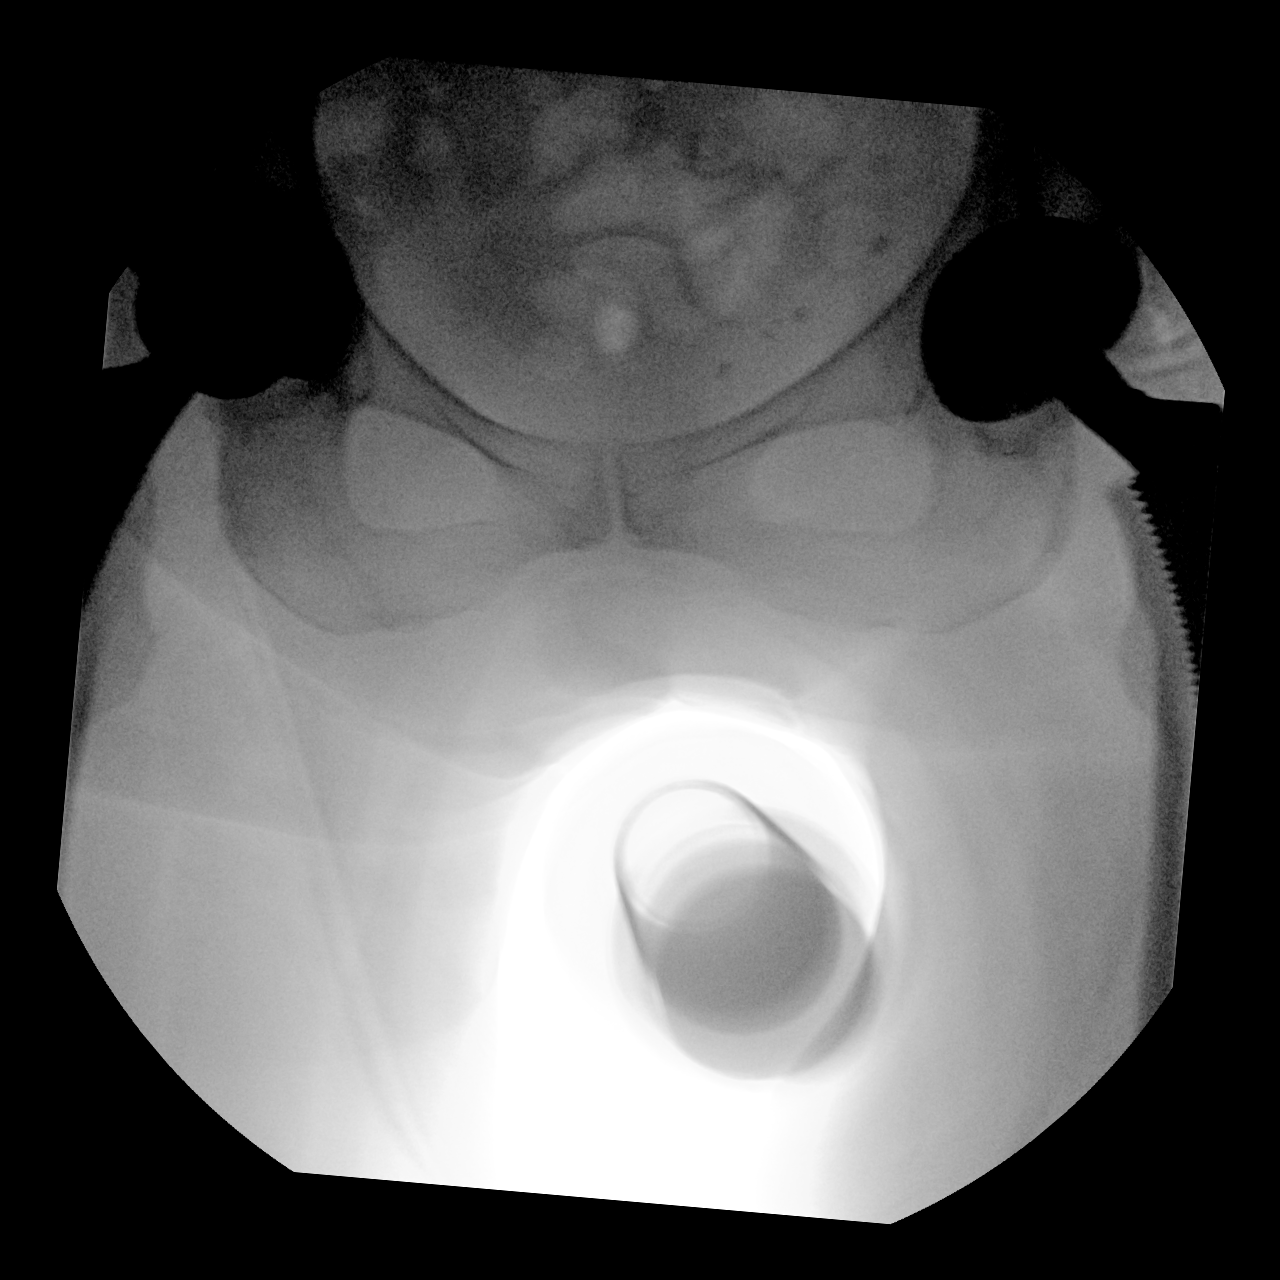

[2 of 2 positions shown; findings below may reference images not displayed]

FINDINGS: Two spot fluoroscopic images, single AP view provided. The total
fluoroscopic time is 12 seconds.

Total left hip arthroplasty appears intact and in anatomic
alignment.
IMPRESSION: Status post total left hip arthroplasty.

## 2021-05-08 IMAGING — DX DG PORTABLE PELVIS
1 series · 1 of 1 positions shown · non-contrast
Comparison: 11/19/2018, 08/18/2014

CLINICAL DATA: Status post left hip replacement

EXAM:
PORTABLE PELVIS 1-2 VIEWS

[pelvis ap]
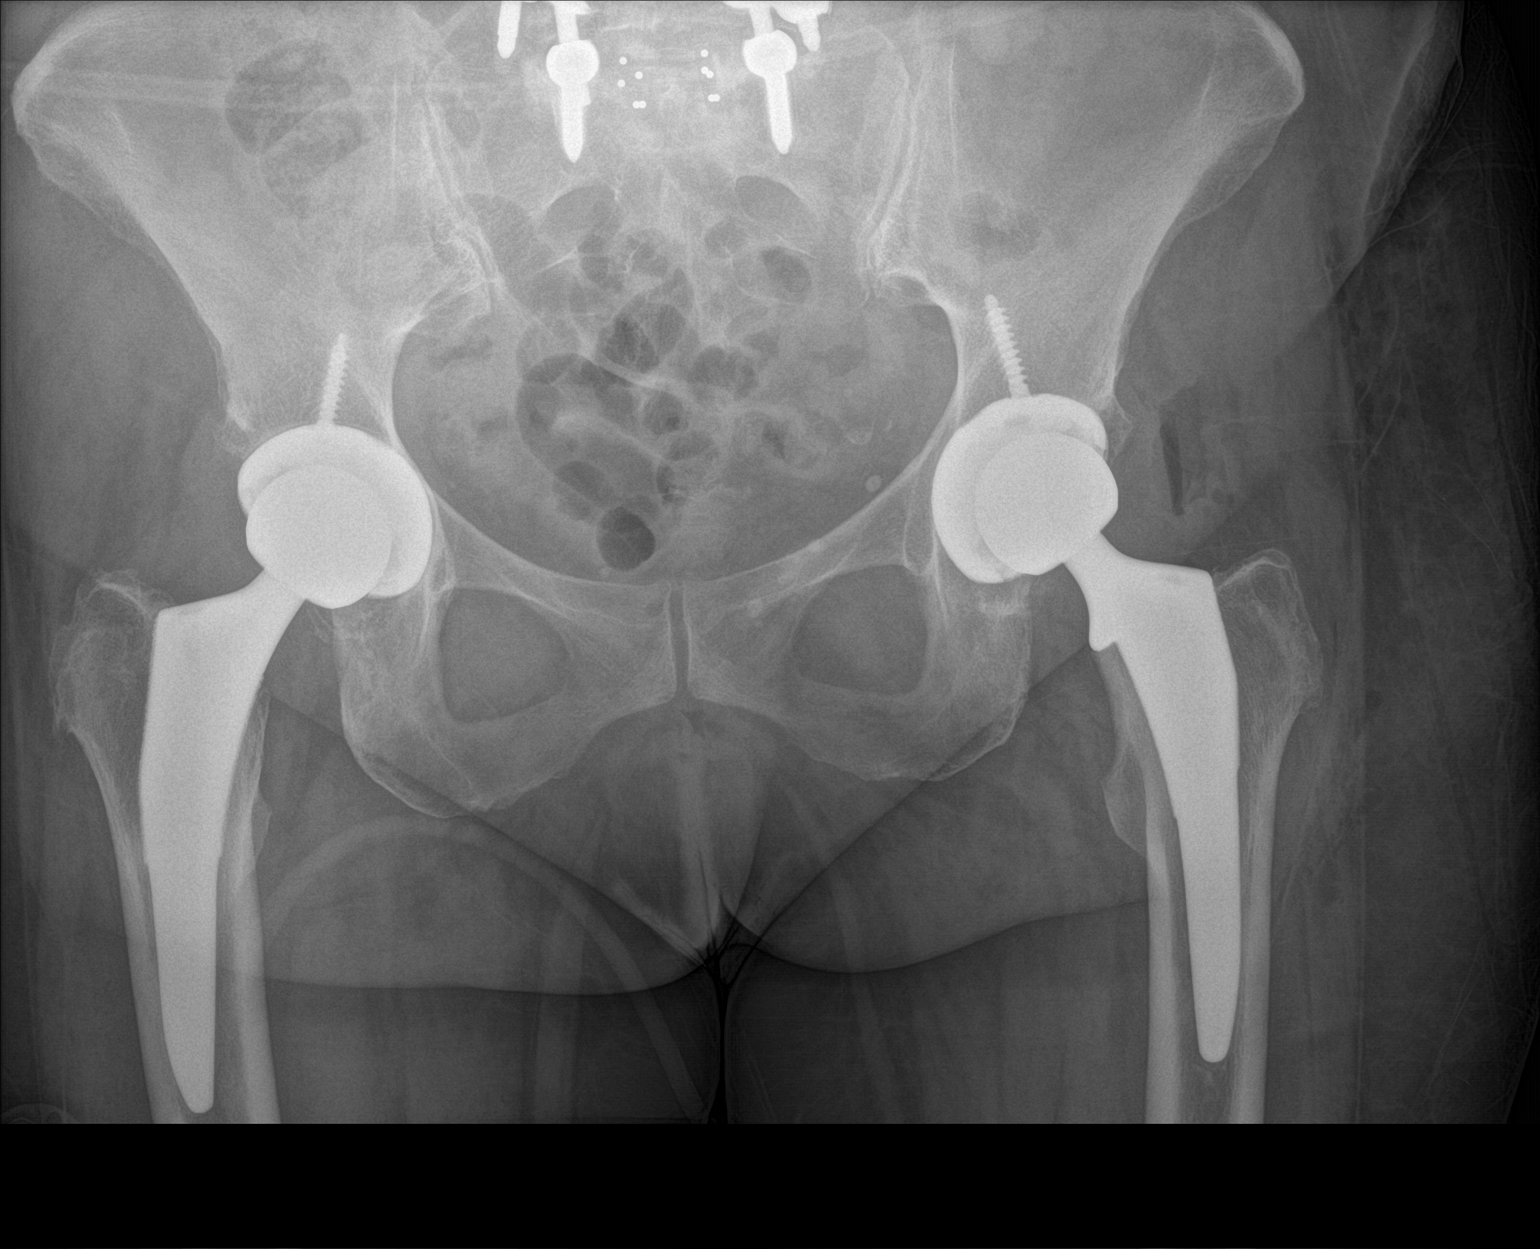

[1 of 1 positions shown; findings below may reference images not displayed]

FINDINGS: Surgical hardware in the lumbosacral spine. Previous right hip
replacement with normal alignment. Interval left hip replacement
with intact hardware and normal alignment. Gas in the soft tissues
consistent with recent surgery.
IMPRESSION: Interval left hip replacement with expected postsurgical changes

## 2021-05-23 ENCOUNTER — Ambulatory Visit: Payer: Medicare Other | Admitting: Podiatry

## 2021-06-29 ENCOUNTER — Ambulatory Visit (INDEPENDENT_AMBULATORY_CARE_PROVIDER_SITE_OTHER): Payer: Medicare Other

## 2021-06-29 ENCOUNTER — Ambulatory Visit: Payer: Medicare Other | Admitting: Podiatry

## 2021-06-29 DIAGNOSIS — M7752 Other enthesopathy of left foot: Secondary | ICD-10-CM | POA: Diagnosis not present

## 2021-06-29 DIAGNOSIS — M2041 Other hammer toe(s) (acquired), right foot: Secondary | ICD-10-CM | POA: Diagnosis not present

## 2021-06-29 DIAGNOSIS — M21619 Bunion of unspecified foot: Secondary | ICD-10-CM

## 2021-06-29 DIAGNOSIS — M79672 Pain in left foot: Secondary | ICD-10-CM

## 2021-06-29 MED ORDER — TRIAMCINOLONE ACETONIDE 10 MG/ML IJ SUSP
10.0000 mg | Freq: Once | INTRAMUSCULAR | Status: AC
  Administered 2021-06-29: 10 mg

## 2021-06-29 NOTE — Progress Notes (Signed)
Subjective:   Patient ID: Robyn Smith, female   DOB: 66 y.o.   MRN: [id number]   HPI Patient presents stating she developed a lot of pain in her left ankle and her right foot has become increasingly sore with a hammertoe bunion stating she is tried wider shoes she has tried soaks she has tried padding and nothing is working anymore and she knows she needs to get the foot fixed   ROS      Objective:  Physical Exam  Neurovascular status intact with inflammation sinus tarsi left and also discomfort around the second MPJ left not as severe with significant structural deformity right first MPJ and severe elevation of the second digit with rigid contracture at the MPJ     Assessment:  Inflammatory capsulitis of the sinus tarsi left localized with inflamed second MPJ and patient is found to have structural bunion deformity right getting worse with rigid contracture digit to right painful      Plan:  H&P all conditions reviewed for the left I did sterile prep injected the sinus tarsi 3 mg Kenalog 5 mg Xylocaine and discussed possible future injection of the second MPJ and for the right we discussed digital correction with straightening procedure along with distal osteotomy and patient is opted for this treatment plan.  At this point I did allow her to read consent form going over all possible questions and alternative treatments and patient wants surgery and after extensive review signed consent form.  Understands total recovery can take upwards of 6 months and at this point I did dispense air fracture walker which was fitted properly to her lower leg and all instructions given on usage and I want her to work with it prior to procedure  X-rays do indicate elevation of the intermetatarsal angle right with severe rigid contracture digit to right no signs of arthritis of the left sinus tarsi with arthritis of the left second MPJ with possible [place]

## 2021-07-05 ENCOUNTER — Telehealth: Payer: Self-pay | Admitting: Urology

## 2021-07-05 NOTE — Telephone Encounter (Addendum)
DOS - 09/20/21  [place] BUNIONECTOMY RIGHT --- 61443 HAMMERTOE REPAIR 2ND RIGHT --- 15400  [employer] MEDICARE EFFECTIVE DATE - 07/09/16   PLAN DEDUCTIBLE - $150.00 W/ $0.00 REMAINING OUT OF POCKET - $1,500.00 W/ $1,236.00  REMAINING COINSURANCE - 5% COPAY - $0.00   NO PRIOR AUTH REQUIRED

## 2021-08-08 ENCOUNTER — Encounter: Payer: Medicare Other | Admitting: Podiatry

## 2021-09-19 MED ORDER — OXYCODONE-ACETAMINOPHEN 10-325 MG PO TABS: 1.0000 | ORAL_TABLET | ORAL | 0 refills | Status: DC | PRN

## 2021-09-19 MED ORDER — ONDANSETRON HCL 4 MG PO TABS: 4.0000 mg | ORAL_TABLET | Freq: Three times a day (TID) | ORAL | 0 refills | Status: AC | PRN

## 2021-09-19 NOTE — Addendum Note (Signed)
Addended by: Lenn Sink on: 09/19/2021 01:22 PM   Modules accepted: Orders

## 2021-09-20 ENCOUNTER — Encounter: Payer: Self-pay | Admitting: Podiatry

## 2021-09-20 DIAGNOSIS — M2041 Other hammer toe(s) (acquired), right foot: Secondary | ICD-10-CM

## 2021-09-20 DIAGNOSIS — M7752 Other enthesopathy of left foot: Secondary | ICD-10-CM

## 2021-09-20 DIAGNOSIS — M2011 Hallux valgus (acquired), right foot: Secondary | ICD-10-CM

## 2021-09-21 ENCOUNTER — Telehealth: Payer: Self-pay | Admitting: *Deleted

## 2021-09-21 NOTE — Telephone Encounter (Signed)
Patient is calling to ask if she may remove her boot while resting,thought it was mentioned after her surgery by physician, post surgical instructions are saying to keep on at all times until first post op. Please advise

## 2021-09-21 NOTE — Telephone Encounter (Signed)
Patient has been instructed to take boot off when resting per physician.

## 2021-09-21 NOTE — Telephone Encounter (Signed)
Can take off when resting

## 2021-09-26 ENCOUNTER — Ambulatory Visit (INDEPENDENT_AMBULATORY_CARE_PROVIDER_SITE_OTHER): Payer: Medicare Other | Admitting: Podiatry

## 2021-09-26 ENCOUNTER — Ambulatory Visit (INDEPENDENT_AMBULATORY_CARE_PROVIDER_SITE_OTHER): Payer: Medicare Other

## 2021-09-26 DIAGNOSIS — Z9889 Other specified postprocedural states: Secondary | ICD-10-CM

## 2021-09-28 NOTE — Progress Notes (Signed)
Subjective:   Patient ID: Robyn Smith, female   DOB: 66 y.o.   MRN: [id number]   HPI Patient states doing well with surgery with minimal discomfort   ROS      Objective:  Physical Exam  Neurovascular status intact negative Bevelyn Buckles' sign noted wound edges coapted well hallux in rectus position stitches intact lesser digits pin intact second toe     Assessment:  Doing well post forefoot reconstruction right     Plan:  Reviewed condition recommended the continuation of immobilization elevation compression with sterile dressing reapplied.  Reappoint 2 weeks suture removal in 4 weeks to see me in 4 pin removal  X-rays indicate good healing of osteotomy fixation in place toe in good alignment

## 2021-10-10 ENCOUNTER — Encounter: Payer: Self-pay | Admitting: Podiatry

## 2021-10-10 ENCOUNTER — Ambulatory Visit (INDEPENDENT_AMBULATORY_CARE_PROVIDER_SITE_OTHER): Payer: Medicare Other | Admitting: Podiatry

## 2021-10-10 ENCOUNTER — Ambulatory Visit (INDEPENDENT_AMBULATORY_CARE_PROVIDER_SITE_OTHER): Payer: Medicare Other

## 2021-10-10 DIAGNOSIS — Z9889 Other specified postprocedural states: Secondary | ICD-10-CM

## 2021-10-11 NOTE — Progress Notes (Signed)
Subjective:   Patient ID: Robyn Smith, female   DOB: 66 y.o.   MRN: [id number]   HPI Patient states she is doing well but she is concerned that she has some swelling around the big toe joint and may have walked on her foot not sure if that will occurred but it has been moderately sore.  Still overall doing well   ROS      Objective:  Physical Exam  Neurovascular status intact negative Bevelyn Buckles' sign noted wound edges right coapted well everything is in good alignment pin still intact second digit with moderate swelling around the first MPJ right with good range of motion no crepitus      Assessment:  Possibility that there may have been some trauma around the first MPJ with possibility for slight bone movement given the swelling with good alignment second digit pin intact slightly protruding distal     Plan:  H&P reviewed condition discussed x-ray that she will need to stay immobilized for the next few weeks even though I do think it will heal at this position and hopefully will not give her any long-term issues.  Today I did push the pin in the second digit to a more functional position and hopefully that will stay in for 2 more weeks and I want to see her back in 2 weeks to rex-ray and make sure there is been no further movement of the first metatarsal but she will stay immobilized at that time and is under no circumstances to put pressure off  X-rays indicate that there is been distal crack in the osteotomy with some lifting of the dorsal portion of the bone structure that still being held in place by fixation with the plantar part being held in place with fixation with the second digit good alignment good position with slight distal protrusion of the pain

## 2021-10-24 ENCOUNTER — Ambulatory Visit (INDEPENDENT_AMBULATORY_CARE_PROVIDER_SITE_OTHER): Payer: Medicare Other | Admitting: Podiatry

## 2021-10-24 ENCOUNTER — Encounter: Payer: Self-pay | Admitting: Podiatry

## 2021-10-24 ENCOUNTER — Ambulatory Visit (INDEPENDENT_AMBULATORY_CARE_PROVIDER_SITE_OTHER): Payer: Medicare Other

## 2021-10-24 DIAGNOSIS — Z9889 Other specified postprocedural states: Secondary | ICD-10-CM

## 2021-10-26 NOTE — Progress Notes (Signed)
Subjective:   Patient ID: Robyn Smith, female   DOB: 66 y.o.   MRN: [id number]   HPI Patient states doing very well very pleased with surgery ready to get pin out   ROS      Objective:  Physical Exam  Neurovascular status intact negative Bevelyn Buckles' sign noted pin in place second digit good alignment of the digit noted     Assessment:  Doing well post osteotomy digital fusion with pin in place     Plan:  Pin removed sterile dressing applied and instructed on gradual increase in activities dispensed ankle compression stocking reappoint to recheck again in 4 weeks or earlier if needed  X-rays indicate digits in good alignment no signs pathology did have a fracture of the first metatarsal but it is stable is not moving and she will continue immobilization for several more weeks and it should heal uneventfully with good range of motion of the joint

## 2021-11-28 ENCOUNTER — Ambulatory Visit (INDEPENDENT_AMBULATORY_CARE_PROVIDER_SITE_OTHER): Payer: Medicare Other

## 2021-11-28 ENCOUNTER — Ambulatory Visit (INDEPENDENT_AMBULATORY_CARE_PROVIDER_SITE_OTHER): Payer: Medicare Other | Admitting: Podiatry

## 2021-11-28 DIAGNOSIS — M79672 Pain in left foot: Secondary | ICD-10-CM

## 2021-11-28 DIAGNOSIS — M7752 Other enthesopathy of left foot: Secondary | ICD-10-CM | POA: Diagnosis not present

## 2021-11-28 DIAGNOSIS — T81509A Unspecified complication of foreign body accidentally left in body following unspecified procedure, initial encounter: Secondary | ICD-10-CM | POA: Diagnosis not present

## 2021-11-28 MED ORDER — TRIAMCINOLONE ACETONIDE 10 MG/ML IJ SUSP
10.0000 mg | Freq: Once | INTRAMUSCULAR | Status: AC
  Administered 2021-11-28: 10 mg

## 2021-11-29 ENCOUNTER — Other Ambulatory Visit: Payer: Self-pay | Admitting: Podiatry

## 2021-11-29 DIAGNOSIS — T81509A Unspecified complication of foreign body accidentally left in body following unspecified procedure, initial encounter: Secondary | ICD-10-CM

## 2021-11-29 DIAGNOSIS — M7752 Other enthesopathy of left foot: Secondary | ICD-10-CM

## 2021-11-29 NOTE — Progress Notes (Signed)
Subjective:   Patient ID: Robyn Smith, female   DOB: 66 y.o.   MRN: [id number]   HPI Patient states that she still is having quite a bit of pain in the right foot if she is on it too much but she is wearing a regular shoe currently and states she is getting pain in her left foot also and might need to work on that at 1 point in future   ROS      Objective:  Physical Exam  Neurovascular status intact with patient's right foot healing reasonably well for 9 weeks after surgery with patient found to have good range of motion no crepitus of the joint currently did have some trauma to the right first metatarsal head with lifting but clinically it does appear to be healing with mild discomfort around the second MPJ.  The left foot shows a lot of pain in the sinus tarsi     Assessment:  Patient did have a crack in the distal osteotomy site right but I do think it is stable and it clinically appears to be healing but she is only 9 weeks after surgery normal to still have discomfort with inflammation that is developed into the sinus tarsi left     Plan:  Reviewed the fact that the patient is wearing a shoe right I do think it will gradually get better we will get a try to hold off on anti-inflammatories I advised on aggressive ice therapy elevation still and making sure to wear shoe gear with rigid bottoms with healing still to occur around the first metatarsal head.  There may be some second MPJ inflammation due to increased stress but I will evaluate that later too early to make determination and went ahead today and injected the left sinus tarsi 3 mg Kenalog 5 mg Xylocaine to reduce inflammation reappoint 1 month to recheck  X-rays indicate there has been an unfortunate crack in the dorsal segment of the [facility] right but it is stable from previous comparison x-rays and over time should heal uneventfully but will probably take a couple more months.  No other pathology noted currently with some  shortening of the first MPJ

## 2021-12-28 ENCOUNTER — Encounter: Payer: Self-pay | Admitting: Podiatry

## 2021-12-28 ENCOUNTER — Ambulatory Visit: Payer: Medicare Other | Admitting: Podiatry

## 2021-12-28 DIAGNOSIS — M778 Other enthesopathies, not elsewhere classified: Secondary | ICD-10-CM

## 2021-12-28 DIAGNOSIS — M21619 Bunion of unspecified foot: Secondary | ICD-10-CM

## 2021-12-28 MED ORDER — TRIAMCINOLONE ACETONIDE 10 MG/ML IJ SUSP
10.0000 mg | Freq: Once | INTRAMUSCULAR | Status: AC
  Administered 2021-12-28: 10 mg

## 2021-12-28 NOTE — Progress Notes (Signed)
Subjective:   Patient ID: Robyn Smith, female   DOB: 66 y.o.   MRN: [id number]   HPI Patient states the right foot has really improved but having a lot of pain on top of the left foot   ROS      Objective:  Physical Exam  Neuro vascular status intact with patient's right foot doing well excellent range of motion no crepitus of the joint minimal swelling with the left extensor tendon group midtarsal joint found to be inflamed     Assessment:  Doing well post surgery right with good range of motion and patient back to normal activity with extensor tendinitis left     Plan:  H&P reviewed condition recommended focusing on the left as the right is doing well and I went ahead today did sterile prep injected the extensor complex 3 mg dexamethasone Kenalog 5 mg Xylocaine after explaining risk and went ahead advised on ice therapy topical anti-inflammatories

## 2022-05-18 ENCOUNTER — Ambulatory Visit (INDEPENDENT_AMBULATORY_CARE_PROVIDER_SITE_OTHER): Payer: Medicare Other

## 2022-05-18 ENCOUNTER — Encounter: Payer: Self-pay | Admitting: Podiatry

## 2022-05-18 ENCOUNTER — Ambulatory Visit: Payer: Medicare Other | Admitting: Podiatry

## 2022-05-18 DIAGNOSIS — M778 Other enthesopathies, not elsewhere classified: Secondary | ICD-10-CM

## 2022-05-18 DIAGNOSIS — M76822 Posterior tibial tendinitis, left leg: Secondary | ICD-10-CM

## 2022-05-18 DIAGNOSIS — M7751 Other enthesopathy of right foot: Secondary | ICD-10-CM

## 2022-05-18 MED ORDER — TRIAMCINOLONE ACETONIDE 10 MG/ML IJ SUSP
20.0000 mg | Freq: Once | INTRAMUSCULAR | Status: AC
  Administered 2022-05-18: 20 mg

## 2022-05-18 NOTE — Progress Notes (Signed)
Subjective:   Patient ID: Robyn Smith, female   DOB: 67 y.o.   MRN: [id number]   HPI Patient presents with a lot of pain in the inside of the left ankle stating it has been sore for a few months and then on the right it is more in the forefoot around the metatarsal joints.  Patient states the left has been worse especially over the last month   ROS      Objective:  Physical Exam  Neurovascular status intact inflammation of the posterior tibial tendon left as it comes underneath the medial malleolus with inflammation of the second MPJ right with fluid buildup mildly on the left     Assessment:  Inflammatory posterior tibial tendinitis left with moderate flatfoot deformity and inflammatory capsulitis second MPJ right     Plan:  H&P x-rays reviewed and I went ahead today did careful sheath injection left 3 mg dexamethasone Kenalog 5 mg Xylocaine and for the right I went ahead and injected periarticular around the MPJ 2 mg Dexasone Kenalog 5 mg Xylocaine and patient will be seen back to reevaluate and applied fascial brace left to lift up the arch and take pressure off the posterior tibial tendon and advised on supportive shoes  X-rays indicate previous surgery looks good with arthritis around the second MPJ bilateral and moderate flatfoot deformity left over right

## 2022-06-08 ENCOUNTER — Ambulatory Visit: Payer: Medicare Other | Admitting: Podiatry

## 2022-08-29 ENCOUNTER — Other Ambulatory Visit: Payer: Self-pay | Admitting: Neurosurgery

## 2022-09-01 ENCOUNTER — Other Ambulatory Visit: Payer: Self-pay | Admitting: Neurosurgery

## 2022-09-19 NOTE — Pre-Procedure Instructions (Addendum)
Surgical Instructions    Your procedure is scheduled on Wednesday, September 18th.  Report to [facility] Main Entrance "A" at 10:00 A.M., then check in with the Admitting office.  Call this number if you have problems the morning of surgery:  [phone]  If you have any questions prior to your surgery date call [phone]: Open Monday-Friday 8am-4pm If you experience any cold or flu symptoms such as cough, fever, chills, shortness of breath, etc. between now and your scheduled surgery, please notify us at the above number.     Remember:  Do not eat or drink after midnight the night before your surgery.    Take these medicines the morning of surgery with A SIP OF WATER: oxyCODONE-acetaminophen (PERCOCET)   As of today, STOP taking any Aspirin (unless otherwise instructed by your surgeon) Aleve, Naproxen, Ibuprofen, Motrin, Advil, Goody's, BC's, all herbal medications, fish oil, and all vitamins. This includes: celecoxib (CELEBREX)                      Do NOT Smoke (Tobacco/Vaping) for 24 hours prior to your procedure.  If you use a CPAP at night, you may bring your mask/headgear for your overnight stay.   Contacts, glasses, piercing's, hearing aid's, dentures or partials may not be worn into surgery, please bring cases for these belongings.    For patients admitted to the hospital, discharge time will be determined by your treatment team.   Patients discharged the day of surgery will not be allowed to drive home, and someone needs to stay with them for 24 hours.  SURGICAL WAITING ROOM VISITATION Patients having surgery or a procedure may have no more than 2 support people in the waiting area - these visitors may rotate.   Children under the age of 33 must have an adult with them who is not the patient. If the patient needs to stay at the hospital during part of their recovery, the visitor guidelines for inpatient rooms apply. Pre-op nurse will coordinate an appropriate time for  1 support person to accompany patient in pre-op.  This support person may not rotate.   Please refer to the [facility] website for the visitor guidelines for Inpatients (after your surgery is over and you are in a regular room).    Special instructions:     Pre-operative 5 CHG Bath Instructions   You can play a key role in reducing the risk of infection after surgery. Your skin needs to be as free of germs as possible. You can reduce the number of germs on your skin by washing with CHG (chlorhexidine gluconate) soap before surgery. CHG is an antiseptic soap that kills germs and continues to kill germs even after washing.   DO NOT use if you have an allergy to chlorhexidine/CHG or antibacterial soaps. If your skin becomes reddened or irritated, stop using the CHG and notify one of our RNs at [phone].   Please shower with the CHG soap starting 4 days before surgery using the following schedule:     Please keep in mind the following:  DO NOT shave, including legs and underarms, starting the day of your first shower.   You may shave your face at any point before/day of surgery.  Place clean sheets on your bed the day you start using CHG soap. Use a clean washcloth (not used since being washed) for each shower. DO NOT sleep with pets once you start using the CHG.   CHG Shower Instructions:  If you choose to wash your hair and private area, wash first with your normal shampoo/soap.  After you use shampoo/soap, rinse your hair and body thoroughly to remove shampoo/soap residue.  Turn the water OFF and apply about 3 tablespoons (45 ml) of CHG soap to a CLEAN washcloth.  Apply CHG soap ONLY FROM YOUR NECK DOWN TO YOUR TOES (washing for 3-5 minutes)  DO NOT use CHG soap on face, private areas, open wounds, or sores.  Pay special attention to the area where your surgery is being performed.  If you are having back surgery, having someone wash your back for you may be helpful. Wait 2 minutes  after CHG soap is applied, then you may rinse off the CHG soap.  Pat dry with a clean towel  Put on clean clothes/pajamas   If you choose to wear lotion, please use ONLY the CHG-compatible lotions on the back of this paper.     Additional instructions for the day of surgery: DO NOT APPLY any lotions, deodorants, cologne, or perfumes.   Do not wear nail polish, gel polish, artificial nails, or any other type of covering on natural nails (fingers and toes) Put on clean/comfortable clothes.  Brush your teeth.  Ask your nurse before applying any prescription medications to the skin.      CHG Compatible Lotions   Aveeno Moisturizing lotion  Cetaphil Moisturizing Cream  Cetaphil Moisturizing Lotion  Clairol Herbal Essence Moisturizing Lotion, Dry Skin  Clairol Herbal Essence Moisturizing Lotion, Extra Dry Skin  Clairol Herbal Essence Moisturizing Lotion, Normal Skin  Curel Age Defying Therapeutic Moisturizing Lotion with Alpha Hydroxy  Curel Extreme Care Body Lotion  Curel Soothing Hands Moisturizing Hand Lotion  Curel Therapeutic Moisturizing Cream, Fragrance-Free  Curel Therapeutic Moisturizing Lotion, Fragrance-Free  Curel Therapeutic Moisturizing Lotion, Original Formula  Eucerin Daily Replenishing Lotion  Eucerin Dry Skin Therapy Plus Alpha Hydroxy Crme  Eucerin Dry Skin Therapy Plus Alpha Hydroxy Lotion  Eucerin Original Crme  Eucerin Original Lotion  Eucerin Plus Crme Eucerin Plus Lotion  Eucerin TriLipid Replenishing Lotion  Keri Anti-Bacterial Hand Lotion  Keri Deep Conditioning Original Lotion Dry Skin Formula Softly Scented  Keri Deep Conditioning Original Lotion, Fragrance Free Sensitive Skin Formula  Keri Lotion Fast Absorbing Fragrance Free Sensitive Skin Formula  Keri Lotion Fast Absorbing Softly Scented Dry Skin Formula  Keri Original Lotion  Keri Skin Renewal Lotion Keri Silky Smooth Lotion  Keri Silky Smooth Sensitive Skin Lotion  Nivea Body Creamy  Conditioning Oil  Nivea Body Extra Enriched Lotion  Nivea Body Original Lotion  Nivea Body Sheer Moisturizing Lotion Nivea Crme  Nivea Skin Firming Lotion  NutraDerm 30 Skin Lotion  NutraDerm Skin Lotion  NutraDerm Therapeutic Skin Cream  NutraDerm Therapeutic Skin Lotion  ProShield Protective Hand Cream  Provon moisturizing lotion  Please read over the following fact sheets that you were given.    If you received a COVID test during your pre-op visit  it is requested that you wear a mask when out in public, stay away from anyone that may not be feeling well and notify your surgeon if you develop symptoms. If you have been in contact with anyone that has tested positive in the last 10 days please notify you surgeon.

## 2022-09-20 ENCOUNTER — Other Ambulatory Visit: Payer: Self-pay

## 2022-09-20 ENCOUNTER — Encounter (HOSPITAL_COMMUNITY): Payer: Self-pay

## 2022-09-20 ENCOUNTER — Encounter (HOSPITAL_COMMUNITY)
Admission: RE | Admit: 2022-09-20 | Discharge: 2022-09-20 | Disposition: A | Discharge status: Home / Self Care | Payer: Medicare Other | Source: Ambulatory Visit | Attending: Neurosurgery

## 2022-09-20 VITALS — BP 139/69 | HR 70 | Temp 98.4°F | Resp 17 | Ht 69.0 in | Wt 212.5 lb

## 2022-09-20 DIAGNOSIS — Z01810 Encounter for preprocedural cardiovascular examination: Secondary | ICD-10-CM | POA: Insufficient documentation

## 2022-09-20 DIAGNOSIS — I739 Peripheral vascular disease, unspecified: Secondary | ICD-10-CM | POA: Diagnosis not present

## 2022-09-20 DIAGNOSIS — Z01818 Encounter for other preprocedural examination: Secondary | ICD-10-CM | POA: Diagnosis present

## 2022-09-20 DIAGNOSIS — Z01812 Encounter for preprocedural laboratory examination: Secondary | ICD-10-CM | POA: Insufficient documentation

## 2022-09-20 HISTORY — DX: Polyneuropathy, unspecified: G62.9

## 2022-09-20 LAB — CBC
HCT: 39.6 % (ref 36.0–46.0)
Hemoglobin: 12.7 g/dL (ref 12.0–15.0)
MCH: 31.1 pg (ref 26.0–34.0)
MCHC: 32.1 g/dL (ref 30.0–36.0)
MCV: 97.1 fL (ref 80.0–100.0)
Platelets: 302 10*3/uL (ref 150–400)
RBC: 4.08 MIL/uL (ref 3.87–5.11)
RDW: 13.6 % (ref 11.5–15.5)
WBC: 8.5 10*3/uL (ref 4.0–10.5)
nRBC: 0 % (ref 0.0–0.2)

## 2022-09-20 LAB — BASIC METABOLIC PANEL
Anion gap: 10 (ref 5–15)
BUN: 16 mg/dL (ref 8–23)
CO2: 24 mmol/L (ref 22–32)
Calcium: 8.9 mg/dL (ref 8.9–10.3)
Chloride: 106 mmol/L (ref 98–111)
Creatinine, Ser: 0.78 mg/dL (ref 0.44–1.00)
GFR, Estimated: >60 mL/min (ref >60)
Glucose, Bld: 97 mg/dL (ref 70–99)
Potassium: 4 mmol/L (ref 3.5–5.1)
Sodium: 140 mmol/L (ref 135–145)

## 2022-09-20 LAB — SURGICAL PCR SCREEN
MRSA, PCR: NEGATIVE
Staphylococcus aureus: POSITIVE — AB

## 2022-09-20 NOTE — Progress Notes (Signed)
PCP - August Otilio Saber, MD Cardiologist - Dr. Freddy Finner  PPM/ICD - Denies  Chest x-ray - Denies EKG - 09/20/2022 Stress Test - Patient states within the past 5 years with Dr. Hyacinth Meeker. Patient information has been faxed to Dr. Garen Lah office for test results. ECHO - Patient states within the past 2 years with Dr. Hyacinth Meeker. Information has been faxed to Dr. Rondel Baton office for results. Cardiac Cath - Denies  Sleep Study - Denies  DM: Denies  Blood Thinner Instructions: Hold celebrex 7 days prior to surgery. Aspirin Instructions: N/A  ERAS Protcol - N/A PRE-SURGERY Ensure or G2- N/A  COVID TEST- No   Anesthesia review: Yes, cardiac history.  Patient denies shortness of breath, fever, cough and chest pain at PAT appointment   All instructions explained to the patient, with a verbal understanding of the material. Patient agrees to go over the instructions while at home for a better understanding.The opportunity to ask questions was provided.

## 2022-09-21 ENCOUNTER — Encounter (HOSPITAL_COMMUNITY): Payer: Self-pay

## 2022-09-21 NOTE — Progress Notes (Signed)
Case: [id number] Date/Time: 09/27/22 1145   Procedure: T10-11 LAMINOTOMIES (Bilateral) - 3C   Anesthesia type: General   Pre-op diagnosis: THORACIC SPINAL STENOSIS   [facility] / [facility]   Surgeons: Tressie Stalker, MD       DISCUSSION: Robyn Smith is a 67 yo female who presents to PAT prior to surgery above. PMH significant for PVD, GERD, fibromyalgia, arthritis, anemia, chronic pain syndrome with long term opiate use, s/p lumbar fusion (2016, 2019), s/p R THA (2016), L THA (2020).  Prior anesthesia complications include PONV  Patient saw Cardiology 718/2023 due to fatigue after COVID and ankle edema. An echo was ordered to r/o any cardiac cause which was normal. Paper copy is in chart.  VS: BP 139/69   Pulse 70   Temp 36.9 C   Resp 17   Ht 5\' 9"  (1.753 m)   Wt 96.4 kg   SpO2 98%   BMI 31.38 kg/m   PROVIDERSBetsey Holiday, August Summer, FNP Cardiologist: Daryel November, MD  LABS: Labs reviewed: Acceptable for surgery. (all labs ordered are listed, but only abnormal results are displayed)  Labs Reviewed  SURGICAL PCR SCREEN - Abnormal; Notable for the following components:      Result Value   Staphylococcus aureus POSITIVE (*)    All other components within normal limits  BASIC METABOLIC PANEL  CBC     IMAGES:   EKG 09/19/22:  NSR, rate 69   CV:  Echo 08/24/2021  Conclusions: Normal LV systolic function 60-65% Normal RV function Mild to moderate LVH Normal diastolic function Cardiac chamber imaging demonstrates moderate RV enlargement, and mild to moderate biatrial enlargement Valvular and doppler imaging demonstrates mild aortic insufficiency, mild mitral regurgitation, and mild pulmonic insufficiency Mildly elevated estimated PA systolic pressure No pericardial effusion No other significant findings  Past Medical History:  Diagnosis Date   Anemia    hx of, none since menapause   Anxiety    Arthritis    Burning mouth syndrome    Fibromyalgia     GERD (gastroesophageal reflux disease)    mild   Neuropathy    Peripheral vascular disease (HCC)    Pneumonia    as a child   PONV (postoperative nausea and vomiting)    "some" not too much   Thin skin    on arms   Venous reflux    surgery in oct and nov 2016    Past Surgical History:  Procedure Laterality Date   ANTERIOR LATERAL LUMBAR FUSION 4 LEVELS Left 03/10/2014   Procedure: Left Lumbar Ome-Two,Lumbar Two-three,Lumbar Three-four,Lumbar four-Five Anterior lateral lumbar fusion with percutaneous pedicle screws and rods;  Surgeon: Maeola Harman, MD;  Location: [facility];  Service: Neurosurgery;  Laterality: Left;  left side approach   BACK SURGERY  2013   cervical fusion   CARPAL TUNNEL RELEASE Right ~1991   COLONOSCOPY     HAMMER TOE SURGERY Right    LUMBAR PERCUTANEOUS PEDICLE SCREW 4 LEVEL Left 03/10/2014   Procedure: LUMBAR PERCUTANEOUS PEDICLE SCREW LUMBAR ONE-TWO,LUMBAR TWO-THREE,LUMBARTHREE-FOUR,LUMBAR FOUR-FIVE;  Surgeon: Maeola Harman, MD;  Location: [facility];  Service: Neurosurgery;  Laterality: Left;   NASAL SEPTUM SURGERY     REPLACEMENT TOTAL KNEE Left 2019   TONSILLECTOMY  as teenager   TOTAL HIP ARTHROPLASTY Right 08/18/2014   Procedure: RIGHT TOTAL HIP ARTHROPLASTY ANTERIOR APPROACH;  Surgeon: Durene Romans, MD;  Location: [facility];  Service: Orthopedics;  Laterality: Right;   TOTAL HIP ARTHROPLASTY Left 11/19/2018  Procedure: TOTAL HIP ARTHROPLASTY ANTERIOR APPROACH;  Surgeon: Durene Romans, MD;  Location: [facility];  Service: Orthopedics;  Laterality: Left;  70 mins   TUBAL LIGATION     VAGINAL HYSTERECTOMY  ~2000   VASCULAR SURGERY Bilateral 07/2018   leg vein stripping    MEDICATIONS:  acetaminophen (TYLENOL) 500 MG tablet   celecoxib (CELEBREX) 200 MG capsule   docusate sodium (COLACE) 100 MG capsule   doxycycline (VIBRA-TABS) 100 MG tablet   ferrous sulfate (FERROUSUL) 325 (65 FE) MG tablet   gabapentin (NEURONTIN) 300 MG capsule    methocarbamol (ROBAXIN) 500 MG tablet   Multiple Vitamin (MULTIVITAMIN WITH MINERALS) TABS tablet   ondansetron (ZOFRAN) 4 MG tablet   oxyCODONE 10 MG TABS   oxyCODONE-acetaminophen (PERCOCET) 10-325 MG tablet   polyethylene glycol (MIRALAX / GLYCOLAX) 17 g packet   TURMERIC-FISH OIL PO   No current facility-administered medications for this encounter.   Marcille Blanco [facility] Short Stay/Anesthesiology [facility] Phone [phone] 09/21/2022 1:40 PM

## 2022-09-21 NOTE — Anesthesia Preprocedure Evaluation (Addendum)
Anesthesia Evaluation  Patient identified by MRN, date of birth, ID band Patient awake    Reviewed: Allergy & Precautions, NPO status , Patient's Chart, lab work & pertinent test results  History of Anesthesia Complications (+) PONV  Airway Mallampati: II  TM Distance: >3 FB Neck ROM: Full    Dental no notable dental hx. (+) Teeth Intact, Dental Advisory Given, Implants, Caps   Pulmonary    Pulmonary exam normal breath sounds clear to auscultation       Cardiovascular Normal cardiovascular exam Rhythm:Regular Rate:Normal  Echo 08/24/2021 Normal LV systolic function 60-65%    Neuro/Psych    GI/Hepatic ,GERD  Medicated and Controlled,,  Endo/Other    Renal/GU Lab Results      Component                Value               Date                       K                        4.0                 09/20/2022                    CREATININE               0.78                09/20/2022                    Musculoskeletal  (+) Arthritis ,  Fibromyalgia -  Abdominal   Peds  Hematology Lab Results      Component                Value               Date                      WBC                      8.5                 09/20/2022                HGB                      12.7                09/20/2022                HCT                      39.6                09/20/2022                MCV                      97.1                09/20/2022                PLT  302                 09/20/2022              Anesthesia Other Findings All: PCN  Reproductive/Obstetrics                             Anesthesia Physical Anesthesia Plan  ASA: 2  Anesthesia Plan: General   Post-op Pain Management: Ketamine IV* and Tylenol PO (pre-op)*   Induction: Intravenous  PONV Risk Score and Plan: 4 or greater and Treatment may vary due to age or medical condition, Midazolam, Ondansetron and  Dexamethasone  Airway Management Planned: Oral ETT  Additional Equipment: None  Intra-op Plan:   Post-operative Plan: Extubation in OR  Informed Consent: I have reviewed the patients History and Physical, chart, labs and discussed the procedure including the risks, benefits and alternatives for the proposed anesthesia with the patient or authorized representative who has indicated his/her understanding and acceptance.     Dental advisory given  Plan Discussed with: CRNA and Anesthesiologist  Anesthesia Plan Comments: (See PAT note from 9/11 by K Gekas PA-C )        Anesthesia Quick Evaluation

## 2022-09-26 NOTE — Progress Notes (Signed)
Spoke with pt's spouse Ameerah Insana, pt will arrive tom at 0800. NPO post midnight.

## 2022-09-27 ENCOUNTER — Other Ambulatory Visit: Payer: Self-pay

## 2022-09-27 ENCOUNTER — Encounter (HOSPITAL_COMMUNITY)
Admission: RE | Disposition: A | Discharge status: Home / Self Care | Payer: Self-pay | Source: Home / Self Care | Attending: Neurosurgery

## 2022-09-27 ENCOUNTER — Ambulatory Visit (HOSPITAL_BASED_OUTPATIENT_CLINIC_OR_DEPARTMENT_OTHER): Payer: Medicare Other | Admitting: Anesthesiology

## 2022-09-27 ENCOUNTER — Ambulatory Visit (HOSPITAL_COMMUNITY): Payer: Medicare Other

## 2022-09-27 ENCOUNTER — Ambulatory Visit (HOSPITAL_COMMUNITY): Payer: Medicare Other | Admitting: Medical

## 2022-09-27 ENCOUNTER — Encounter (HOSPITAL_COMMUNITY): Payer: Self-pay | Admitting: Neurosurgery

## 2022-09-27 ENCOUNTER — Ambulatory Visit (HOSPITAL_COMMUNITY)
Admission: RE | Admit: 2022-09-27 | Discharge: 2022-09-28 | Disposition: A | Discharge status: Home / Self Care | Payer: Medicare Other | Attending: Neurosurgery | Admitting: Neurosurgery

## 2022-09-27 DIAGNOSIS — M4804 Spinal stenosis, thoracic region: Secondary | ICD-10-CM

## 2022-09-27 DIAGNOSIS — M4314 Spondylolisthesis, thoracic region: Secondary | ICD-10-CM | POA: Insufficient documentation

## 2022-09-27 DIAGNOSIS — G992 Myelopathy in diseases classified elsewhere: Secondary | ICD-10-CM | POA: Insufficient documentation

## 2022-09-27 HISTORY — PX: THORACIC DISCECTOMY: SHX6113

## 2022-09-27 SURGERY — THORACIC DISCECTOMY
Anesthesia: General | Laterality: Bilateral

## 2022-09-27 MED ORDER — SUFENTANIL CITRATE 50 MCG/ML IV SOLN
INTRAVENOUS | Status: DC | PRN
  Administered 2022-09-27: .1 ug/kg/h via INTRAVENOUS

## 2022-09-27 MED ORDER — MIDAZOLAM HCL 2 MG/2ML IJ SOLN
INTRAMUSCULAR | Status: DC | PRN
  Administered 2022-09-27: 2 mg via INTRAVENOUS

## 2022-09-27 MED ORDER — MENTHOL 3 MG MT LOZG: 1.0000 | LOZENGE | OROMUCOSAL | Status: DC | PRN

## 2022-09-27 MED ORDER — PHENYLEPHRINE HCL-NACL 20-0.9 MG/250ML-% IV SOLN
INTRAVENOUS | Status: DC | PRN
  Administered 2022-09-27: 30 ug/min via INTRAVENOUS

## 2022-09-27 MED ORDER — CHLORHEXIDINE GLUCONATE CLOTH 2 % EX PADS: 6.0000 | MEDICATED_PAD | Freq: Once | CUTANEOUS | Status: DC

## 2022-09-27 MED ORDER — LIDOCAINE 2% (20 MG/ML) 5 ML SYRINGE
INTRAMUSCULAR | Status: AC
  Filled 2022-09-27: qty 5

## 2022-09-27 MED ORDER — PHENYLEPHRINE 80 MCG/ML (10ML) SYRINGE FOR IV PUSH (FOR BLOOD PRESSURE SUPPORT)
PREFILLED_SYRINGE | INTRAVENOUS | Status: AC
  Filled 2022-09-27: qty 10

## 2022-09-27 MED ORDER — ORAL CARE MOUTH RINSE: 15.0000 mL | Freq: Once | OROMUCOSAL | Status: AC

## 2022-09-27 MED ORDER — BUPIVACAINE-EPINEPHRINE (PF) 0.5% -1:200000 IJ SOLN
INTRAMUSCULAR | Status: DC | PRN
  Administered 2022-09-27 (×2): 10 mL

## 2022-09-27 MED ORDER — EPHEDRINE SULFATE-NACL 50-0.9 MG/10ML-% IV SOSY
PREFILLED_SYRINGE | INTRAVENOUS | Status: DC | PRN
  Administered 2022-09-27: 5 mg via INTRAVENOUS

## 2022-09-27 MED ORDER — ONDANSETRON HCL 4 MG/2ML IJ SOLN: 4.0000 mg | Freq: Four times a day (QID) | INTRAMUSCULAR | Status: DC | PRN

## 2022-09-27 MED ORDER — SUGAMMADEX SODIUM 200 MG/2ML IV SOLN
INTRAVENOUS | Status: DC | PRN
  Administered 2022-09-27: 200 mg via INTRAVENOUS

## 2022-09-27 MED ORDER — MORPHINE SULFATE (PF) 4 MG/ML IV SOLN: 4.0000 mg | INTRAVENOUS | Status: DC | PRN

## 2022-09-27 MED ORDER — FERROUS SULFATE 325 (65 FE) MG PO TABS
325.0000 mg | ORAL_TABLET | Freq: Three times a day (TID) | ORAL | Status: DC
  Administered 2022-09-27 – 2022-09-28 (×3): 325 mg via ORAL
  Filled 2022-09-27 (×3): qty 1

## 2022-09-27 MED ORDER — ACETAMINOPHEN 10 MG/ML IV SOLN
INTRAVENOUS | Status: AC
  Filled 2022-09-27: qty 100

## 2022-09-27 MED ORDER — MIDAZOLAM HCL 2 MG/2ML IJ SOLN
INTRAMUSCULAR | Status: AC
  Filled 2022-09-27: qty 2

## 2022-09-27 MED ORDER — OXYCODONE-ACETAMINOPHEN 5-325 MG PO TABS
1.0000 | ORAL_TABLET | ORAL | Status: DC | PRN
  Administered 2022-09-27 – 2022-09-28 (×6): 1 via ORAL
  Filled 2022-09-27 (×6): qty 1

## 2022-09-27 MED ORDER — CELECOXIB 200 MG PO CAPS
200.0000 mg | ORAL_CAPSULE | Freq: Every day | ORAL | Status: DC
  Administered 2022-09-27 – 2022-09-28 (×2): 200 mg via ORAL
  Filled 2022-09-27 (×2): qty 1

## 2022-09-27 MED ORDER — FENTANYL CITRATE (PF) 250 MCG/5ML IJ SOLN
INTRAMUSCULAR | Status: DC | PRN
  Administered 2022-09-27: 100 ug via INTRAVENOUS

## 2022-09-27 MED ORDER — LIDOCAINE 2% (20 MG/ML) 5 ML SYRINGE
INTRAMUSCULAR | Status: DC | PRN
  Administered 2022-09-27: 100 mg via INTRAVENOUS

## 2022-09-27 MED ORDER — SUFENTANIL CITRATE 50 MCG/ML IV SOLN
0.2500 ug/kg/h | INTRAVENOUS | Status: DC
  Filled 2022-09-27: qty 1

## 2022-09-27 MED ORDER — 0.9 % SODIUM CHLORIDE (POUR BTL) OPTIME
TOPICAL | Status: DC | PRN
  Administered 2022-09-27: 1000 mL

## 2022-09-27 MED ORDER — LACTATED RINGERS IV SOLN: INTRAVENOUS | Status: DC

## 2022-09-27 MED ORDER — DEXAMETHASONE SODIUM PHOSPHATE 10 MG/ML IJ SOLN
INTRAMUSCULAR | Status: AC
  Filled 2022-09-27: qty 1

## 2022-09-27 MED ORDER — OXYCODONE-ACETAMINOPHEN 10-325 MG PO TABS: 1.0000 | ORAL_TABLET | ORAL | Status: DC | PRN

## 2022-09-27 MED ORDER — SODIUM CHLORIDE 0.9 % IV SOLN: 250.0000 mL | INTRAVENOUS | Status: DC

## 2022-09-27 MED ORDER — BACITRACIN ZINC 500 UNIT/GM EX OINT
TOPICAL_OINTMENT | CUTANEOUS | Status: DC | PRN
  Administered 2022-09-27: 1 via TOPICAL

## 2022-09-27 MED ORDER — HYDROMORPHONE HCL 1 MG/ML IJ SOLN
0.2500 mg | INTRAMUSCULAR | Status: DC | PRN
  Administered 2022-09-27: 0.5 mg via INTRAVENOUS

## 2022-09-27 MED ORDER — PROPOFOL 10 MG/ML IV BOLUS
INTRAVENOUS | Status: DC | PRN
  Administered 2022-09-27: 170 mg via INTRAVENOUS

## 2022-09-27 MED ORDER — ONDANSETRON HCL 4 MG/2ML IJ SOLN
INTRAMUSCULAR | Status: AC
  Filled 2022-09-27: qty 2

## 2022-09-27 MED ORDER — SUCCINYLCHOLINE CHLORIDE 200 MG/10ML IV SOSY
PREFILLED_SYRINGE | INTRAVENOUS | Status: AC
  Filled 2022-09-27: qty 10

## 2022-09-27 MED ORDER — THROMBIN 5000 UNITS EX SOLR
OROMUCOSAL | Status: DC | PRN
  Administered 2022-09-27: 5 mL via TOPICAL

## 2022-09-27 MED ORDER — DOCUSATE SODIUM 100 MG PO CAPS
100.0000 mg | ORAL_CAPSULE | Freq: Two times a day (BID) | ORAL | Status: DC
  Administered 2022-09-27 – 2022-09-28 (×3): 100 mg via ORAL
  Filled 2022-09-27 (×3): qty 1

## 2022-09-27 MED ORDER — ACETAMINOPHEN 650 MG RE SUPP: 650.0000 mg | RECTAL | Status: DC | PRN

## 2022-09-27 MED ORDER — THROMBIN 5000 UNITS EX SOLR
CUTANEOUS | Status: AC
  Filled 2022-09-27: qty 5000

## 2022-09-27 MED ORDER — MUPIROCIN 2 % EX OINT
1.0000 | TOPICAL_OINTMENT | Freq: Two times a day (BID) | CUTANEOUS | Status: DC
  Administered 2022-09-27 – 2022-09-28 (×2): 1 via NASAL
  Filled 2022-09-27: qty 22

## 2022-09-27 MED ORDER — BUPIVACAINE-EPINEPHRINE (PF) 0.5% -1:200000 IJ SOLN
INTRAMUSCULAR | Status: AC
  Filled 2022-09-27: qty 30

## 2022-09-27 MED ORDER — PROPOFOL 500 MG/50ML IV EMUL
INTRAVENOUS | Status: DC | PRN
  Administered 2022-09-27: 30 ug/kg/min via INTRAVENOUS

## 2022-09-27 MED ORDER — KETAMINE HCL 10 MG/ML IJ SOLN
INTRAMUSCULAR | Status: DC | PRN
Start: 2022-09-27 — End: 2022-09-27
  Administered 2022-09-27: 20 mg via INTRAVENOUS

## 2022-09-27 MED ORDER — ACETAMINOPHEN 10 MG/ML IV SOLN
1000.0000 mg | Freq: Once | INTRAVENOUS | Status: DC | PRN
  Administered 2022-09-27: 1000 mg via INTRAVENOUS

## 2022-09-27 MED ORDER — FENTANYL CITRATE (PF) 250 MCG/5ML IJ SOLN
INTRAMUSCULAR | Status: AC
  Filled 2022-09-27: qty 5

## 2022-09-27 MED ORDER — SODIUM CHLORIDE 0.9% FLUSH: 3.0000 mL | INTRAVENOUS | Status: DC | PRN

## 2022-09-27 MED ORDER — ROCURONIUM BROMIDE 10 MG/ML (PF) SYRINGE
PREFILLED_SYRINGE | INTRAVENOUS | Status: DC | PRN
  Administered 2022-09-27: 80 mg via INTRAVENOUS
  Administered 2022-09-27: 10 mg via INTRAVENOUS

## 2022-09-27 MED ORDER — CHLORHEXIDINE GLUCONATE 0.12 % MT SOLN
OROMUCOSAL | Status: AC
  Administered 2022-09-27: 15 mL via OROMUCOSAL
  Filled 2022-09-27: qty 15

## 2022-09-27 MED ORDER — VANCOMYCIN HCL IN DEXTROSE 1-5 GM/200ML-% IV SOLN
INTRAVENOUS | Status: AC
  Administered 2022-09-27: 1000 mg via INTRAVENOUS
  Filled 2022-09-27: qty 200

## 2022-09-27 MED ORDER — ONDANSETRON HCL 4 MG/2ML IJ SOLN
INTRAMUSCULAR | Status: DC | PRN
  Administered 2022-09-27: 4 mg via INTRAVENOUS

## 2022-09-27 MED ORDER — OXYCODONE HCL 5 MG/5ML PO SOLN: 5.0000 mg | Freq: Once | ORAL | Status: DC | PRN

## 2022-09-27 MED ORDER — AMISULPRIDE (ANTIEMETIC) 5 MG/2ML IV SOLN: 10.0000 mg | Freq: Once | INTRAVENOUS | Status: DC | PRN

## 2022-09-27 MED ORDER — SODIUM CHLORIDE 0.9% FLUSH
3.0000 mL | Freq: Two times a day (BID) | INTRAVENOUS | Status: DC
  Administered 2022-09-27: 3 mL via INTRAVENOUS

## 2022-09-27 MED ORDER — BACITRACIN ZINC 500 UNIT/GM EX OINT
TOPICAL_OINTMENT | CUTANEOUS | Status: AC
  Filled 2022-09-27: qty 28.35

## 2022-09-27 MED ORDER — DEXAMETHASONE SODIUM PHOSPHATE 10 MG/ML IJ SOLN
INTRAMUSCULAR | Status: DC | PRN
  Administered 2022-09-27: 10 mg via INTRAVENOUS

## 2022-09-27 MED ORDER — VANCOMYCIN HCL IN DEXTROSE 1-5 GM/200ML-% IV SOLN
1000.0000 mg | Freq: Once | INTRAVENOUS | Status: AC
  Administered 2022-09-27: 1000 mg via INTRAVENOUS
  Filled 2022-09-27: qty 200

## 2022-09-27 MED ORDER — ALUM & MAG HYDROXIDE-SIMETH 200-200-20 MG/5ML PO SUSP: 30.0000 mL | ORAL | Status: DC | PRN

## 2022-09-27 MED ORDER — KETAMINE HCL 50 MG/5ML IJ SOSY
PREFILLED_SYRINGE | INTRAMUSCULAR | Status: AC
  Filled 2022-09-27: qty 5

## 2022-09-27 MED ORDER — GLYCOPYRROLATE 0.2 MG/ML IJ SOLN
INTRAMUSCULAR | Status: DC | PRN
Start: 2022-09-27 — End: 2022-09-27
  Administered 2022-09-27: .2 mg via INTRAVENOUS

## 2022-09-27 MED ORDER — VANCOMYCIN HCL IN DEXTROSE 1-5 GM/200ML-% IV SOLN: 1000.0000 mg | INTRAVENOUS | Status: AC

## 2022-09-27 MED ORDER — ONDANSETRON HCL 4 MG PO TABS: 4.0000 mg | ORAL_TABLET | Freq: Four times a day (QID) | ORAL | Status: DC | PRN

## 2022-09-27 MED ORDER — BISACODYL 10 MG RE SUPP: 10.0000 mg | Freq: Every day | RECTAL | Status: DC | PRN

## 2022-09-27 MED ORDER — OXYCODONE HCL 5 MG PO TABS
5.0000 mg | ORAL_TABLET | ORAL | Status: DC | PRN
  Administered 2022-09-27 – 2022-09-28 (×6): 5 mg via ORAL
  Filled 2022-09-27 (×6): qty 1

## 2022-09-27 MED ORDER — CHLORHEXIDINE GLUCONATE 0.12 % MT SOLN: 15.0000 mL | Freq: Once | OROMUCOSAL | Status: AC

## 2022-09-27 MED ORDER — ONDANSETRON HCL 4 MG PO TABS: 4.0000 mg | ORAL_TABLET | Freq: Three times a day (TID) | ORAL | Status: DC | PRN

## 2022-09-27 MED ORDER — PHENYLEPHRINE 80 MCG/ML (10ML) SYRINGE FOR IV PUSH (FOR BLOOD PRESSURE SUPPORT)
PREFILLED_SYRINGE | INTRAVENOUS | Status: DC | PRN
  Administered 2022-09-27: 80 ug via INTRAVENOUS
  Administered 2022-09-27: 100 ug via INTRAVENOUS

## 2022-09-27 MED ORDER — OXYCODONE HCL 5 MG PO TABS: 5.0000 mg | ORAL_TABLET | Freq: Once | ORAL | Status: DC | PRN

## 2022-09-27 MED ORDER — PHENOL 1.4 % MT LIQD: 1.0000 | OROMUCOSAL | Status: DC | PRN

## 2022-09-27 MED ORDER — ROCURONIUM BROMIDE 10 MG/ML (PF) SYRINGE
PREFILLED_SYRINGE | INTRAVENOUS | Status: AC
  Filled 2022-09-27: qty 10

## 2022-09-27 MED ORDER — METHOCARBAMOL 500 MG PO TABS
500.0000 mg | ORAL_TABLET | Freq: Four times a day (QID) | ORAL | Status: DC | PRN
  Administered 2022-09-27 – 2022-09-28 (×2): 500 mg via ORAL
  Filled 2022-09-27 (×3): qty 1

## 2022-09-27 MED ORDER — SCOPOLAMINE 1 MG/3DAYS TD PT72
1.0000 | MEDICATED_PATCH | TRANSDERMAL | Status: DC
  Administered 2022-09-27: 1.5 mg via TRANSDERMAL
  Filled 2022-09-27: qty 1

## 2022-09-27 MED ORDER — GABAPENTIN 300 MG PO CAPS
300.0000 mg | ORAL_CAPSULE | Freq: Three times a day (TID) | ORAL | Status: DC
  Filled 2022-09-27: qty 1

## 2022-09-27 MED ORDER — PROPOFOL 10 MG/ML IV BOLUS
INTRAVENOUS | Status: AC
  Filled 2022-09-27: qty 20

## 2022-09-27 MED ORDER — ZOLPIDEM TARTRATE 5 MG PO TABS: 5.0000 mg | ORAL_TABLET | Freq: Every evening | ORAL | Status: DC | PRN

## 2022-09-27 MED ORDER — HYDROMORPHONE HCL 1 MG/ML IJ SOLN
INTRAMUSCULAR | Status: AC
  Filled 2022-09-27: qty 1

## 2022-09-27 MED ORDER — ONDANSETRON HCL 4 MG/2ML IJ SOLN: 4.0000 mg | Freq: Once | INTRAMUSCULAR | Status: DC | PRN

## 2022-09-27 MED ORDER — ACETAMINOPHEN 500 MG PO TABS
1000.0000 mg | ORAL_TABLET | Freq: Four times a day (QID) | ORAL | Status: AC
  Administered 2022-09-27: 500 mg via ORAL
  Filled 2022-09-27: qty 2

## 2022-09-27 MED ORDER — ACETAMINOPHEN 325 MG PO TABS
650.0000 mg | ORAL_TABLET | ORAL | Status: DC | PRN
  Administered 2022-09-28: 650 mg via ORAL
  Filled 2022-09-27: qty 2

## 2022-09-27 SURGICAL SUPPLY — 45 items
APL SKNCLS STERI-STRIP NONHPOA (GAUZE/BANDAGES/DRESSINGS) ×1
BAG COUNTER SPONGE SURGICOUNT (BAG) ×1 IMPLANT
BAG SPNG CNTER NS LX DISP (BAG) ×1
BENZOIN TINCTURE PRP APPL 2/3 (GAUZE/BANDAGES/DRESSINGS) ×1 IMPLANT
BLADE CLIPPER SURG (BLADE) IMPLANT
BUR MATCHSTICK NEURO 3.0 LAGG (BURR) ×1 IMPLANT
BUR PRECISION FLUTE 6.0 (BURR) ×1 IMPLANT
CANISTER SUCT 3000ML PPV (MISCELLANEOUS) ×1 IMPLANT
DRAPE LAPAROTOMY 100X72X124 (DRAPES) ×1 IMPLANT
DRAPE MICROSCOPE SLANT 54X150 (MISCELLANEOUS) ×1 IMPLANT
DRAPE SURG 17X23 STRL (DRAPES) ×4 IMPLANT
DRSG OPSITE POSTOP 4X6 (GAUZE/BANDAGES/DRESSINGS) IMPLANT
ELECT BLADE 4.0 EZ CLEAN MEGAD (MISCELLANEOUS) ×1
ELECT REM PT RETURN 9FT ADLT (ELECTROSURGICAL) ×1
ELECTRODE BLDE 4.0 EZ CLN MEGD (MISCELLANEOUS) ×1 IMPLANT
ELECTRODE REM PT RTRN 9FT ADLT (ELECTROSURGICAL) ×1 IMPLANT
GAUZE 4X4 16PLY ~~LOC~~+RFID DBL (SPONGE) IMPLANT
GAUZE SPONGE 4X4 12PLY STRL (GAUZE/BANDAGES/DRESSINGS) ×1 IMPLANT
GLOVE BIO SURGEON STRL SZ8 (GLOVE) ×1 IMPLANT
GLOVE BIO SURGEON STRL SZ8.5 (GLOVE) ×1 IMPLANT
GLOVE EXAM NITRILE XL STR (GLOVE) IMPLANT
GOWN STRL REUS W/ TWL LRG LVL3 (GOWN DISPOSABLE) IMPLANT
GOWN STRL REUS W/ TWL XL LVL3 (GOWN DISPOSABLE) ×1 IMPLANT
GOWN STRL REUS W/TWL 2XL LVL3 (GOWN DISPOSABLE) IMPLANT
GOWN STRL REUS W/TWL LRG LVL3 (GOWN DISPOSABLE)
GOWN STRL REUS W/TWL XL LVL3 (GOWN DISPOSABLE) ×1
HEMOSTAT POWDER KIT SURGIFOAM (HEMOSTASIS) ×1 IMPLANT
KIT BASIN OR (CUSTOM PROCEDURE TRAY) ×1 IMPLANT
KIT TURNOVER KIT B (KITS) ×1 IMPLANT
NDL HYPO 21X1.5 SAFETY (NEEDLE) IMPLANT
NDL HYPO 22X1.5 SAFETY MO (MISCELLANEOUS) ×1 IMPLANT
NEEDLE HYPO 21X1.5 SAFETY (NEEDLE) IMPLANT
NEEDLE HYPO 22X1.5 SAFETY MO (MISCELLANEOUS) ×1 IMPLANT
NS IRRIG 1000ML POUR BTL (IV SOLUTION) ×1 IMPLANT
PACK LAMINECTOMY NEURO (CUSTOM PROCEDURE TRAY) ×1 IMPLANT
PAD ARMBOARD 7.5X6 YLW CONV (MISCELLANEOUS) ×3 IMPLANT
PATTIES SURGICAL .5 X1 (DISPOSABLE) IMPLANT
SPONGE SURGIFOAM ABS GEL SZ50 (HEMOSTASIS) IMPLANT
STRIP CLOSURE SKIN 1/2X4 (GAUZE/BANDAGES/DRESSINGS) ×1 IMPLANT
SUT VIC AB 1 CT1 18XBRD ANBCTR (SUTURE) ×2 IMPLANT
SUT VIC AB 1 CT1 8-18 (SUTURE) ×2
SUT VIC AB 2-0 CP2 18 (SUTURE) ×2 IMPLANT
TOWEL GREEN STERILE (TOWEL DISPOSABLE) ×1 IMPLANT
TOWEL GREEN STERILE FF (TOWEL DISPOSABLE) ×1 IMPLANT
WATER STERILE IRR 1000ML POUR (IV SOLUTION) ×1 IMPLANT

## 2022-09-27 NOTE — H&P (Signed)
Subjective: The patient is a 67 year old white female who has complained of chronic back pain with numbness and tingling into her bilateral legs.  She has failed medical management and was worked up with a thoracic MRI which demonstrated significant stenosis at T10-11.  I discussed the various treatment options with the patient.  She has decided proceed with surgery.  Past Medical History:  Diagnosis Date   Anemia    hx of, none since menapause   Anxiety    Arthritis    Burning mouth syndrome    Fibromyalgia    GERD (gastroesophageal reflux disease)    mild   Neuropathy    Peripheral vascular disease (HCC)    Pneumonia    as a child   PONV (postoperative nausea and vomiting)    "some" not too much   Thin skin    on arms   Venous reflux    surgery in oct and nov 2016    Past Surgical History:  Procedure Laterality Date   ANTERIOR LATERAL LUMBAR FUSION 4 LEVELS Left 03/10/2014   Procedure: Left Lumbar Ome-Two,Lumbar Two-three,Lumbar Three-four,Lumbar four-Five Anterior lateral lumbar fusion with percutaneous pedicle screws and rods;  Surgeon: Maeola Harman, MD;  [facility];  Service: Neurosurgery;  Laterality: Left;  left side approach   BACK SURGERY  2013   cervical fusion   CARPAL TUNNEL RELEASE Right ~1991   COLONOSCOPY     HAMMER TOE SURGERY Right    LUMBAR PERCUTANEOUS PEDICLE SCREW 4 LEVEL Left 03/10/2014   Procedure: LUMBAR PERCUTANEOUS PEDICLE SCREW LUMBAR ONE-TWO,LUMBAR TWO-THREE,LUMBARTHREE-FOUR,LUMBAR FOUR-FIVE;  Surgeon: Maeola Harman, MD;  Location[facility];  Service: Neurosurgery;  Laterality: Left;   NASAL SEPTUM SURGERY     REPLACEMENT TOTAL KNEE Left 2019   TONSILLECTOMY  as teenager   TOTAL HIP ARTHROPLASTY Right 08/18/2014   Procedure: RIGHT TOTAL HIP ARTHROPLASTY ANTERIOR APPROACH;  Surgeon: Durene Romans, MD;  Location: [facility];  Service: Orthopedics;  Laterality: Right;   TOTAL HIP ARTHROPLASTY Left 11/19/2018   Procedure: TOTAL HIP  ARTHROPLASTY ANTERIOR APPROACH;  Surgeon: Durene Romans, MD;  Location: [facility];  Service: Orthopedics;  Laterality: Left;  70 mins   TUBAL LIGATION     VAGINAL HYSTERECTOMY  ~2000   VASCULAR SURGERY Bilateral 07/2018   leg vein stripping    Allergies  Allergen Reactions   Penicillins Rash    Did it involve swelling of the face/tongue/throat, SOB, or low BP? No Did it involve sudden or severe rash/hives, skin peeling, or any reaction on the inside of your mouth or nose? No Did you need to seek medical attention at a hospital or doctor's office? No When did it last happen?      Childhood allergy If all above answers are "NO", may proceed with cephalosporin use.  Other reaction(s): Other (See Comments) UNSPECIFIED CHILDHOOD REACTION Has patient had a PCN reaction causing immediate rash, facial/tongue/throat swelling, SOB or lightheadedness with hypotension: Unknown Has patient had a PCN reaction causing severe rash involving mucus membranes or skin necrosis: Unknown Has patient had a PCN reaction that required hospitalization: No Has patient had a PCN reaction occurring within the last 10 years: No Childhood reaction If all of the above answers are "NO", then may proceed with Cep    Social History   Tobacco Use   Smoking status: Never   Smokeless tobacco: Never  Substance Use Topics   Alcohol use: Yes    Comment: rare    Family History  Problem Relation  Age of Onset   CAD Mother    Diabetes type II Mother    Arthritis Mother    COPD Father    CAD Brother    Diabetes type II Brother    Prior to Admission medications   Medication Sig Start Date End Date Taking? Authorizing Provider  celecoxib (CELEBREX) 200 MG capsule Take 200 mg by mouth daily. 05/18/21  Yes [provider]  Multiple Vitamin (MULTIVITAMIN WITH MINERALS) TABS tablet Take 1 tablet by mouth daily.   Yes [provider]  oxyCODONE-acetaminophen (PERCOCET) 10-325 MG tablet Take 1 tablet by mouth  every 4 (four) hours as needed for pain. Patient taking differently: Take 1 tablet by mouth in the morning, at noon, and at bedtime. 09/19/21  Yes Regal, Kirstie Peri, DPM  TURMERIC-FISH OIL PO Take 1 capsule by mouth daily.   Yes [provider]  acetaminophen (TYLENOL) 500 MG tablet Take 2 tablets (1,000 mg total) by mouth every 8 (eight) hours. Patient not taking: Reported on 12/28/2021 11/20/18   Lanney Gins, PA-C  docusate sodium (COLACE) 100 MG capsule Take 1 capsule (100 mg total) by mouth 2 (two) times daily. Patient not taking: Reported on 12/28/2021 11/20/18   Lanney Gins, PA-C  doxycycline (VIBRA-TABS) 100 MG tablet Take 1 tablet (100 mg total) by mouth 2 (two) times daily. Patient not taking: Reported on 12/28/2021 12/16/19   Vivi Barrack, DPM  ferrous sulfate (FERROUSUL) 325 (65 FE) MG tablet Take 1 tablet (325 mg total) by mouth 3 (three) times daily with meals for 14 days. Patient not taking: Reported on 09/07/2022 11/20/18 12/04/18  Lanney Gins, PA-C  gabapentin (NEURONTIN) 300 MG capsule Take 1 capsule (300 mg total) by mouth 3 (three) times daily. Patient not taking: Reported on 12/28/2021 09/01/20   Lenn Sink, DPM  methocarbamol (ROBAXIN) 500 MG tablet Take 1 tablet (500 mg total) by mouth every 6 (six) hours as needed for muscle spasms. Patient not taking: Reported on 12/28/2021 11/20/18   Lanney Gins, PA-C  ondansetron (ZOFRAN) 4 MG tablet Take 1 tablet (4 mg total) by mouth every 8 (eight) hours as needed for nausea or vomiting. Patient not taking: Reported on 12/28/2021 09/19/21   Lenn Sink, DPM  oxyCODONE 10 MG TABS Take 1-2 tablets (10-20 mg total) by mouth every 4 (four) hours as needed for moderate pain or severe pain. Patient not taking: Reported on 12/28/2021 11/20/18   Lanney Gins, PA-C  polyethylene glycol (MIRALAX / GLYCOLAX) 17 g packet Take 17 g by mouth 2 (two) times daily. Patient not taking: Reported on 12/28/2021 11/20/18    Lanney Gins, PA-C     Review of Systems  Positive ROS: As above  All other systems have been reviewed and were otherwise negative with the exception of those mentioned in the HPI and as above.  Objective: Vital signs in last 24 hours: Temp:  [98.1 F (36.7 C)] 98.1 F (36.7 C) (09/18 0813) Pulse Rate:  [76] 76 (09/18 0813) Resp:  [18] 18 (09/18 0813) BP: (140)/(69) 140/69 (09/18 0813) SpO2:  [97 %] 97 % (09/18 0813) Weight:  [96.2 kg] 96.2 kg (09/18 0813) Estimated body mass index is 31.31 kg/m as calculated from the following:   Height as of this encounter: 5\' 9"  (1.753 m).   Weight as of this encounter: 96.2 kg.   General Appearance: Alert Head: Normocephalic, without obvious abnormality, atraumatic Eyes: PERRL, conjunctiva/corneas clear, EOM's intact,    Ears: Normal  Throat: Normal  Neck:  Supple, Back: unremarkable Lungs: Clear to auscultation bilaterally, respirations unlabored Heart: Regular rate and rhythm, no murmur, rub or gallop Abdomen: Soft, non-tender Extremities: Extremities normal, atraumatic, no cyanosis or edema Skin: unremarkable  NEUROLOGIC:   Mental status: alert and oriented,Motor Exam - grossly normal Sensory Exam - grossly normal Reflexes:  Coordination - grossly normal Gait - grossly normal Balance - grossly normal Cranial Nerves: I: smell Not tested  II: visual acuity  OS: Normal  OD: Normal   II: visual fields Full to confrontation  II: pupils Equal, round, reactive to light  III,VII: ptosis None  III,IV,VI: extraocular muscles  Full ROM  V: mastication Normal  V: facial light touch sensation  Normal  V,VII: corneal reflex  Present  VII: facial muscle function - upper  Normal  VII: facial muscle function - lower Normal  VIII: hearing Not tested  IX: soft palate elevation  Normal  IX,X: gag reflex Present  XI: trapezius strength  5/5  XI: sternocleidomastoid strength 5/5  XI: neck flexion strength  5/5  XII: tongue  strength  Normal    Data Review Lab Results  Component Value Date   WBC 8.5 09/20/2022   HGB 12.7 09/20/2022   HCT 39.6 09/20/2022   MCV 97.1 09/20/2022   PLT 302 09/20/2022   Lab Results  Component Value Date   NA 140 09/20/2022   K 4.0 09/20/2022   CL 106 09/20/2022   CO2 24 09/20/2022   BUN 16 09/20/2022   CREATININE 0.78 09/20/2022   GLUCOSE 97 09/20/2022   Lab Results  Component Value Date   INR 1.04 08/11/2014    Assessment/Plan: Thoracic spondylolisthesis, thoracic spinal stenosis, thoracic myelopathy: I have discussed situation with the patient and her husband.  I reviewed her imaging studies with him and pointed out the abnormalities.  We discussed the various treatment options going surgery.  I have described the surgical treatment option bilateral T10-11 laminotomy/foraminotomies.  I have shown her surgical models.  I have discussed the risk, benefits, alternatives, expected postop course, and likelihood of achieving our goals with surgery.  I have answered all the patient's questions.  She has decided proceed with surgery.   Cristi Loron 09/27/2022 9:50 AM

## 2022-09-27 NOTE — Anesthesia Postprocedure Evaluation (Signed)
Anesthesia Post Note  Patient: Robyn Smith  Procedure(s) Performed: Thoracic Ten -Eleven LAMINOTOMIES (Bilateral)     Patient location during evaluation: PACU Anesthesia Type: General Level of consciousness: awake and alert Pain management: pain level controlled Vital Signs Assessment: post-procedure vital signs reviewed and stable Respiratory status: spontaneous breathing, nonlabored ventilation, respiratory function stable and patient connected to nasal cannula oxygen Cardiovascular status: blood pressure returned to baseline and stable Postop Assessment: no apparent nausea or vomiting Anesthetic complications: no   No notable events documented.  Last Vitals:  Vitals:   09/27/22 1245 09/27/22 1313  BP: (!) 148/128 134/89  Pulse: 71 69  Resp: 18 18  Temp: (!) 36.1 C 36.6 C  SpO2: 97% 100%    Last Pain:  Vitals:   09/27/22 1405  TempSrc:   PainSc: 6                  Trevor Iha

## 2022-09-27 NOTE — Anesthesia Procedure Notes (Addendum)
Procedure Name: Intubation Date/Time: 09/27/2022 10:13 AM  Performed by: Camillia Herter, CRNAPre-anesthesia Checklist: Patient identified, Emergency Drugs available, Suction available and Patient being monitored Patient Re-evaluated:Patient Re-evaluated prior to induction Oxygen Delivery Method: Circle System Utilized Preoxygenation: Pre-oxygenation with 100% oxygen Induction Type: IV induction Ventilation: Mask ventilation without difficulty Laryngoscope Size: Miller and 2 Grade View: Grade I Tube type: Oral Tube size: 7.5 mm Number of attempts: 1 Airway Equipment and Method: Stylet and Oral airway Placement Confirmation: ETT inserted through vocal cords under direct vision, positive ETCO2 and breath sounds checked- equal and bilateral Tube secured with: Tape Dental Injury: Teeth and Oropharynx as per pre-operative assessment

## 2022-09-27 NOTE — Op Note (Signed)
Brief history: The patient is a 67 year old white female whose had previous lumbar fusions by another physician.  She has developed worsening of back and bilateral leg pain and weakness.  She was worked up with a lumbar and thoracic MRI which demonstrated thoracic spinal stenosis and spondylolisthesis at T10-11.  I discussed the various treatment options with her.  She has decided proceed with surgery.  Preoperative diagnosis: Thoracic spondylolisthesis, spinal stenosis, thoracic myelopathy, back pain  Postoperative diagnosis: The same  Procedure: Bilateral T10 and T11 laminotomy/foraminotomies  using micro-dissection  Surgeon: Dr. Delma Officer  Asst.: Hildred Priest, NP  Anesthesia: Gen. endotracheal  Estimated blood loss: 50 cc  Drains: None  Complications: None  Description of procedure: The patient was brought to the operating room by the anesthesia team. General endotracheal anesthesia was induced. The patient was turned to the prone position on the Wilson frame. The patient's thoracolumbar region was then prepared with Betadine scrub and Betadine solution. Sterile drapes were applied.  I then injected the area to be incised with Marcaine with epinephrine solution. I then used a scalpel to make a linear midline incision over the T10-11 intervertebral disc space. I then used electrocautery to perform a right subperiosteal dissection exposing the spinous process and lamina of T10 and T 11. I then inserted the [facility] retractor for exposure.  We obtained intraoperative x-ray to confirm our location.  We then brought the operative microscope into the field. Under its magnification and illumination we completed the microdissection. I used a high-speed drill to perform a laminotomy at T10-11 on the right. I then used a Kerrison punches to widen the laminotomy and removed the ligamentum flavum at T10-11 on the right, decompressing the thecal sac. We then used microdissection to free up the  thecal sac from the epidural tissue.  I drilled across the midline, performing a left T10-11 l laminotomy.  I then used a Kerrison punch to perform removed the left T10 11 mm flavum decompressing the left thecal sac.  This completed the decompression.  We then obtained hemostasis using bipolar electrocautery. We irrigated the wound out with saline solution. We then removed the retractor. We then reapproximated the patient's thoracolumbar fascia with interrupted #1 Vicryl suture. We then reapproximated the patient's subcutaneous tissue with interrupted 2-0 Vicryl suture. We then reapproximated patient's skin with Steri-Strips and benzoin. The was then coated with bacitracin ointment. The drapes were removed. The patient was subsequently returned to the supine position where they were extubated by the anesthesia team. The patient was then transported to the postanesthesia care unit in stable condition. All sponge instrument and needle counts were reportedly correct at the end of this case.

## 2022-09-27 NOTE — Transfer of Care (Signed)
Immediate Anesthesia Transfer of Care Note  Patient: Robyn Smith  Procedure(s) Performed: Thoracic Ten -Eleven LAMINOTOMIES (Bilateral)  Patient Location: PACU  Anesthesia Type:General  Level of Consciousness: awake, alert , and oriented  Airway & Oxygen Therapy: Patient Spontanous Breathing and Patient connected to face mask oxygen  Post-op Assessment: Report given to RN and Post -op Vital signs reviewed and stable  Post vital signs: Reviewed and stable  Last Vitals:  Vitals Value Taken Time  BP 136/78 09/27/22 1202  Temp    Pulse 82 09/27/22 1212  Resp 11 09/27/22 1212  SpO2 100 % 09/27/22 1212  Vitals shown include unfiled device data.  Last Pain:  Vitals:   09/27/22 0827  TempSrc:   PainSc: 7       Patients Stated Pain Goal: 2 (09/27/22 0827)  Complications: No notable events documented.

## 2022-09-28 ENCOUNTER — Encounter (HOSPITAL_COMMUNITY): Payer: Self-pay | Admitting: Neurosurgery

## 2022-09-28 DIAGNOSIS — M4804 Spinal stenosis, thoracic region: Secondary | ICD-10-CM | POA: Diagnosis not present

## 2022-09-28 MED ORDER — METHOCARBAMOL 500 MG PO TABS: 500.0000 mg | ORAL_TABLET | Freq: Four times a day (QID) | ORAL | 0 refills | Status: AC | PRN

## 2022-09-28 MED ORDER — OXYCODONE-ACETAMINOPHEN 5-325 MG PO TABS: 1.0000 | ORAL_TABLET | ORAL | 0 refills | Status: AC | PRN

## 2022-09-28 NOTE — Discharge Summary (Signed)
Physician Discharge Summary     Providing Compassionate, Quality Care - Together   Patient ID: Robyn Smith MRN: [id number] DOB/AGE: 1955/11/23 67 y.o.  Admit date: 09/27/2022 Discharge date: 09/28/2022  Admission Diagnoses: Thoracic spinal stenosis with myelopathy  Discharge Diagnoses:  Principal Problem:   Myelopathy concurrent with and due to spinal stenosis of thoracic region [facility])   Discharged Condition: good  Hospital Course: Patient underwent T10 and T11 laminotomy/foraminotomies by Dr. Lovell Sheehan on 09/28/2022. She was admitted to 3C08 following recovery from anesthesia in the PACU. Her postoperative course has been uncomplicated. She has worked with both physical and occupational therapies who feel the patient is ready for discharge home. She is ambulating independently and without difficulty. She is tolerating a normal diet. She is not having any bowel or bladder dysfunction. Her pain is well-controlled with oral pain medication. She is ready for discharge home.   Consults: PT  Significant Diagnostic Studies: radiology: DG Lumbar Spine 2-3 Views  Result Date: 09/27/2022 CLINICAL DATA:  Intraoperative localization for T10-11 laminectomies EXAM: LUMBAR SPINE - 2-3 VIEW COMPARISON:  CT lumbar spine dated 03/06/2016 FINDINGS: Known L1-5 PLIF. Using that as reference, the initial cross-table lateral view demonstrates a surgical probe overlying the T10 vertebral body. Second cross-table lateral view demonstrates surgical spreaders posterior to T10-11. IMPRESSION: Intraoperative spine localization, as above. Electronically Signed   By: Charline Bills M.D.   On: 09/27/2022 19:14     Treatments: surgery: Bilateral T10 and T11 laminotomy/foraminotomies  using micro-dissection   Discharge Exam: Blood pressure 115/62, pulse 88, temperature 99.1 F (37.3 C), temperature source Oral, resp. rate 16, height 5\' 9"  (1.753 m), weight 96.2 kg, SpO2 94%.  Alert and oriented x 4 PERRLA CN  II-XII grossly intact MAE, Strength and sensation intact Incision is covered with Honeycomb dressing and Steri Strips; Dressing is clean, dry, and intact   Disposition: Discharge disposition: 01-Home or Self Care       Discharge Instructions     Call MD for:  difficulty breathing, headache or visual disturbances   Complete by: As directed    Call MD for:  hives   Complete by: As directed    Call MD for:  persistant nausea and vomiting   Complete by: As directed    Call MD for:  redness, tenderness, or signs of infection (pain, swelling, redness, odor or green/yellow discharge around incision site)   Complete by: As directed    Call MD for:  severe uncontrolled pain   Complete by: As directed    Diet - low sodium heart healthy   Complete by: As directed    If the dressing is still on your incision site when you go home, remove it on the third day after your surgery date. Remove dressing if it begins to fall off, or if it is dirty or damaged before the third day.   Complete by: As directed    Increase activity slowly   Complete by: As directed       Allergies as of 09/28/2022       Reactions   Penicillins Rash   Did it involve swelling of the face/tongue/throat, SOB, or low BP? No Did it involve sudden or severe rash/hives, skin peeling, or any reaction on the inside of your mouth or nose? No Did you need to seek medical attention at a hospital or doctor's office? No When did it last happen?      Childhood allergy If all above answers are "NO", may proceed  with cephalosporin use. Other reaction(s): Other (See Comments) UNSPECIFIED CHILDHOOD REACTION Has patient had a PCN reaction causing immediate rash, facial/tongue/throat swelling, SOB or lightheadedness with hypotension: Unknown Has patient had a PCN reaction causing severe rash involving mucus membranes or skin necrosis: Unknown Has patient had a PCN reaction that required hospitalization: No Has patient had a PCN  reaction occurring within the last 10 years: No Childhood reaction If all of the above answers are "NO", then may proceed with Cep        Medication List     STOP taking these medications    Oxycodone HCl 10 MG Tabs   oxyCODONE-acetaminophen 10-325 MG tablet Commonly known as: Percocet Replaced by: oxyCODONE-acetaminophen 5-325 MG tablet       TAKE these medications    acetaminophen 500 MG tablet Commonly known as: TYLENOL Take 2 tablets (1,000 mg total) by mouth every 8 (eight) hours.   celecoxib 200 MG capsule Commonly known as: CELEBREX Take 200 mg by mouth daily.   docusate sodium 100 MG capsule Commonly known as: Colace Take 1 capsule (100 mg total) by mouth 2 (two) times daily.   doxycycline 100 MG tablet Commonly known as: VIBRA-TABS Take 1 tablet (100 mg total) by mouth 2 (two) times daily.   ferrous sulfate 325 (65 FE) MG tablet Commonly known as: FerrouSul Take 1 tablet (325 mg total) by mouth 3 (three) times daily with meals for 14 days.   gabapentin 300 MG capsule Commonly known as: NEURONTIN Take 1 capsule (300 mg total) by mouth 3 (three) times daily.   methocarbamol 500 MG tablet Commonly known as: Robaxin Take 1 tablet (500 mg total) by mouth every 6 (six) hours as needed for muscle spasms.   multivitamin with minerals Tabs tablet Take 1 tablet by mouth daily.   ondansetron 4 MG tablet Commonly known as: Zofran Take 1 tablet (4 mg total) by mouth every 8 (eight) hours as needed for nausea or vomiting.   oxyCODONE-acetaminophen 5-325 MG tablet Commonly known as: PERCOCET/ROXICET Take 1-2 tablets by mouth every 4 (four) hours as needed for moderate pain or severe pain (Postoperative pain). Replaces: oxyCODONE-acetaminophen 10-325 MG tablet   polyethylene glycol 17 g packet Commonly known as: MIRALAX / GLYCOLAX Take 17 g by mouth 2 (two) times daily.   TURMERIC-FISH OIL PO Take 1 capsule by mouth daily.               Discharge  Care Instructions  (From admission, onward)           Start     Ordered   09/28/22 0000  If the dressing is still on your incision site when you go home, remove it on the third day after your surgery date. Remove dressing if it begins to fall off, or if it is dirty or damaged before the third day.        09/28/22 1055            Follow-up Information     Tressie Stalker, MD. Go on 10/20/2022.   Specialty: Neurosurgery Why: First post op appointment is on 10/20/2022 at 8:45 AM. Contact information: [address] [place] [address] [phone]                 Signed: Val Eagle, DNP, AGNP-C Nurse Practitioner  [facility] [address], [place], [place] [address] P: [phone]    F: [phone]  09/28/2022, 10:55 AM

## 2022-09-28 NOTE — Discharge Instructions (Signed)
Wound Care Keep incision covered and dry until post op day 3. You may remove the Honeycomb dressing on post op day 3. Leave steri-strips on back.  They will fall off by themselves. Do not put any creams, lotions, or ointments on incision. You are fine to shower. Let water run over incision and pat dry.  Activity Walk each and every day, increasing distance each day. No lifting greater than 5 lbs.  Avoid excessive back motion. No driving for 2 weeks; may ride as a passenger locally.  Diet Resume your normal diet.   Return to Work Will be discussed at your follow up appointment.  Call Your Doctor If Any of These Occur Redness, drainage, or swelling at the wound.  Temperature greater than 101 degrees. Severe pain not relieved by pain medication. Incision starts to come apart.  Follow Up Appt Call [phone] today for appointment in 2-3 weeks if you don't already have one or for any problems.

## 2022-09-28 NOTE — Progress Notes (Signed)
Patient alert and oriented, mae's well, voiding adequate amount of urine, swallowing without difficulty, no c/o pain at time of discharge. Patient discharged home with family. Script and discharged instructions given to patient. Patient and family stated understanding of instructions given. Patient has an appointment with Dr. Lovell Sheehan

## 2022-09-28 NOTE — Evaluation (Signed)
Physical Therapy Evaluation and Discharge Patient Details Name: Robyn Smith MRN: [id number] DOB: 21-May-1955 Today's Date: 09/28/2022  History of Present Illness  Pt is 67 yo female who presents on 09/27/22 for T10-11 laminotomy/ foraminotomy. PMH: fibromyalgia, GERD, PVD, L1-5 fusion 3/16, L TKA, R THA, L THA  Clinical Impression  Patient evaluated by Physical Therapy with no further acute PT needs identified. All education has been completed and the patient has no further questions. Pt reports the numbness she was having in RLE is much better today as well as decreased pain down RLE. Pt ambulated 300' with RW and ascended 6 stairs. Precautions, posture, and activity level reviewed for d/c home. Pt ready for d/c from PT standpoint.  See below for any follow-up Physical Therapy or equipment needs. PT is signing off. Thank you for this referral.         If plan is discharge home, recommend the following: Help with stairs or ramp for entrance;Assistance with cooking/housework   Can travel by private vehicle        Equipment Recommendations None recommended by PT  Recommendations for Other Services       Functional Status Assessment Patient has had a recent decline in their functional status and demonstrates the ability to make significant improvements in function in a reasonable and predictable amount of time.     Precautions / Restrictions Precautions Precautions: Back Precaution Booklet Issued: Yes (comment) Precaution Comments: reviewed precautions, posture, and activity level for home Restrictions Weight Bearing Restrictions: No      Mobility  Bed Mobility Overal bed mobility: Modified Independent             General bed mobility comments: vc's for keeping precautions    Transfers Overall transfer level: Modified independent Equipment used: Rolling walker (2 wheels)                    Ambulation/Gait Ambulation/Gait assistance: Supervision Gait Distance  (Feet): 300 Feet Assistive device: Rolling walker (2 wheels) Gait Pattern/deviations: Step-through pattern Gait velocity: decreased Gait velocity interpretation: >2.62 ft/sec, indicative of community ambulatory   General Gait Details: pt able to maintain erect posture with RW, education given on waiting to come off RW until she can maintain same posture without it  Stairs Stairs: Yes Stairs assistance: Supervision Stair Management: One rail Right, Step to pattern, Forwards, Sideways Number of Stairs: 6 General stair comments: prefers step to pattern due to knee. Began holding rail R hand only but prefers to hold with both so cued to face rail to avoid twisting by bringing far side arm across.  Wheelchair Mobility     Tilt Bed    Modified Rankin (Stroke Patients Only)       Balance Overall balance assessment: Mild deficits observed, not formally tested                                           Pertinent Vitals/Pain Pain Assessment Pain Assessment: 0-10 Pain Score: 4  Pain Location: mid back Pain Descriptors / Indicators: Sore (itching) Pain Intervention(s): Limited activity within patient's tolerance, Premedicated before session, Monitored during session    Home Living Family/patient expects to be discharged to:: Private residence Living Arrangements: Spouse/significant other Available Help at Discharge: Family;Available 24 hours/day Type of Home: House Home Access: Stairs to enter Entrance Stairs-Rails: Right Entrance Stairs-Number of Steps: 6   Home Layout:  One level Home Equipment: Agricultural consultant (2 wheels);Cane - single point;Grab bars - tub/shower;Shower seat - built in;Shower seat;Adaptive equipment      Prior Function Prior Level of Function : Independent/Modified Independent             Mobility Comments: ambulated without AD but had pain ADLs Comments: independent     Extremity/Trunk Assessment   Upper Extremity  Assessment Upper Extremity Assessment: Overall WFL for tasks assessed    Lower Extremity Assessment Lower Extremity Assessment:  (h/o LTKA and BTHA but ROM WNL and strength WFL for mobility)    Cervical / Trunk Assessment Cervical / Trunk Assessment: Back Surgery  Communication   Communication Communication: No apparent difficulties Cueing Techniques: Verbal cues  Cognition Arousal: Alert Behavior During Therapy: WFL for tasks assessed/performed Overall Cognitive Status: Within Functional Limits for tasks assessed                                          General Comments General comments (skin integrity, edema, etc.): [place] and education given on how she can return to various activities AFTER acute phase of healing and how to make these safe for her back . Husband will be doing work in the mean time    Exercises     Assessment/Plan    PT Assessment Patient does not need any further PT services  PT Problem List         PT Treatment Interventions      PT Goals (Current goals can be found in the Care Plan section)  Acute Rehab PT Goals Patient Stated Goal: return home PT Goal Formulation: All assessment and education complete, DC therapy    Frequency       Co-evaluation               AM-PAC PT "6 Clicks" Mobility  Outcome Measure Help needed turning from your back to your side while in a flat bed without using bedrails?: None Help needed moving from lying on your back to sitting on the side of a flat bed without using bedrails?: None Help needed moving to and from a bed to a chair (including a wheelchair)?: None Help needed standing up from a chair using your arms (e.g., wheelchair or bedside chair)?: None Help needed to walk in hospital room?: None Help needed climbing 3-5 steps with a railing? : A Little 6 Click Score: 23    End of Session   Activity Tolerance: Patient tolerated treatment well Patient left: in bed;with call  bell/phone within reach Nurse Communication: Mobility status PT Visit Diagnosis: Pain;Difficulty in walking, not elsewhere classified (R26.2) Pain - part of body:  (mid back)    Time: 0822-0853 PT Time Calculation (min) (ACUTE ONLY): 31 min   Charges:   PT Evaluation $PT Eval Low Complexity: 1 Low PT Treatments $Gait Training: 8-22 mins PT General Charges $$ ACUTE PT VISIT: 1 Visit         Lyanne Co, PT  Acute Rehab Services Secure chat preferred Office [phone]   Robyn Smith 09/28/2022, 9:47 AM

## 2023-03-14 ENCOUNTER — Other Ambulatory Visit: Payer: Self-pay | Admitting: Neurosurgery

## 2023-03-14 DIAGNOSIS — M961 Postlaminectomy syndrome, not elsewhere classified: Secondary | ICD-10-CM

## 2023-03-14 DIAGNOSIS — M4314 Spondylolisthesis, thoracic region: Secondary | ICD-10-CM

## 2023-03-21 ENCOUNTER — Other Ambulatory Visit: Payer: Self-pay | Admitting: Neurosurgery

## 2023-03-21 DIAGNOSIS — M4314 Spondylolisthesis, thoracic region: Secondary | ICD-10-CM

## 2023-03-21 DIAGNOSIS — M961 Postlaminectomy syndrome, not elsewhere classified: Secondary | ICD-10-CM

## 2023-03-21 NOTE — Discharge Instructions (Signed)

## 2023-03-22 ENCOUNTER — Ambulatory Visit
Admission: RE | Admit: 2023-03-22 | Discharge: 2023-03-22 | Discharge status: Home / Self Care | Source: Ambulatory Visit | Attending: Neurosurgery | Admitting: Neurosurgery

## 2023-03-22 ENCOUNTER — Ambulatory Visit
Admission: RE | Admit: 2023-03-22 | Discharge: 2023-03-22 | Disposition: A | Discharge status: Home / Self Care | Source: Ambulatory Visit | Attending: Neurosurgery | Admitting: Neurosurgery

## 2023-03-22 ENCOUNTER — Other Ambulatory Visit: Payer: Self-pay | Admitting: Neurosurgery

## 2023-03-22 ENCOUNTER — Inpatient Hospital Stay
Admission: RE | Admit: 2023-03-22 | Discharge: 2023-03-22 | Disposition: A | Discharge status: Home / Self Care | Payer: Self-pay | Source: Ambulatory Visit | Attending: Neurosurgery | Admitting: Neurosurgery

## 2023-03-22 DIAGNOSIS — M4314 Spondylolisthesis, thoracic region: Secondary | ICD-10-CM

## 2023-03-22 DIAGNOSIS — M961 Postlaminectomy syndrome, not elsewhere classified: Secondary | ICD-10-CM

## 2023-03-22 MED ORDER — DIAZEPAM 5 MG PO TABS
5.0000 mg | ORAL_TABLET | Freq: Once | ORAL | Status: AC
  Administered 2023-03-22: 5 mg via ORAL

## 2023-03-22 MED ORDER — ONDANSETRON HCL 4 MG/2ML IJ SOLN: 4.0000 mg | Freq: Once | INTRAMUSCULAR | Status: DC | PRN

## 2023-03-22 MED ORDER — MEPERIDINE HCL 50 MG/ML IJ SOLN: 50.0000 mg | Freq: Once | INTRAMUSCULAR | Status: DC | PRN

## 2023-03-22 MED ORDER — IOPAMIDOL (ISOVUE-M 300) INJECTION 61%
10.0000 mL | Freq: Once | INTRAMUSCULAR | Status: AC | PRN
  Administered 2023-03-22: 10 mL via INTRATHECAL

## 2023-08-22 ENCOUNTER — Ambulatory Visit: Admitting: Podiatry

## 2023-08-24 ENCOUNTER — Ambulatory Visit (INDEPENDENT_AMBULATORY_CARE_PROVIDER_SITE_OTHER)

## 2023-08-24 ENCOUNTER — Encounter: Payer: Self-pay | Admitting: Podiatry

## 2023-08-24 ENCOUNTER — Ambulatory Visit: Admitting: Podiatry

## 2023-08-24 DIAGNOSIS — M79671 Pain in right foot: Secondary | ICD-10-CM

## 2023-08-24 DIAGNOSIS — M2041 Other hammer toe(s) (acquired), right foot: Secondary | ICD-10-CM | POA: Diagnosis not present

## 2023-08-24 DIAGNOSIS — B351 Tinea unguium: Secondary | ICD-10-CM | POA: Diagnosis not present

## 2023-08-24 DIAGNOSIS — M79674 Pain in right toe(s): Secondary | ICD-10-CM | POA: Diagnosis not present

## 2023-08-24 DIAGNOSIS — M79675 Pain in left toe(s): Secondary | ICD-10-CM | POA: Diagnosis not present

## 2023-08-24 NOTE — Progress Notes (Signed)
 Subjective:   Patient ID: Robyn Smith, female   DOB: 68 y.o.   MRN: [id number]   HPI Patient presents stating that the nailbeds have been bothering her and thick and the second toe right has really been bothering her on the and and has deformity in it.  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  For the nailbed I did go ahead I debrided 1-5 both feet no iatrogenic bleeding.  For the hammertoe I discussed treatment options and we have opted for surgical intervention I have recommended distal arthroplasty and patient wants this done and at this point I went ahead and I allow patient to read and signed consent form for arthroplasty digit to right distal and patient is scheduled to have this procedure done after reviewing and signing consent form and get given all preoperative instructions and materials.  She is encouraged to call with any question she may have and we also will inject the left sinus tarsi at the same time  X-rays indicate significant plantarflexion of the distal portion digit to right with obvious deformity of the inner phalangeal joint

## 2023-08-31 ENCOUNTER — Telehealth: Payer: Self-pay | Admitting: Podiatry

## 2023-08-31 NOTE — Telephone Encounter (Signed)
 DOS- 09/18/2023  DIGIT (2) HAMMERTOE REPAIR RT- [id number]  [employer] EFFECTIVE DATE- 07/09/2016  DEDUCTIBLE- $150 REMAINING- $150 OOP- $1500 COINSURANCE- 5% AFTER DEDUCTIBLE 100%  PER [place]. WITH [employer], NO PRIOR AUTH IS REQUIRED FOR CPT CODE 71714. REF# [id number] PT WAS NOT FOUND ON [employer] [facility], I ASKED REP IF A SUBMISSION THROUGH [employer] IS REQUIRED, REP STATED THAT ONE WAS NOT REQUIRED SINCE AN AUTH WAS NOT REQUIRED ON THEIR END.

## 2023-09-17 ENCOUNTER — Telehealth: Payer: Self-pay | Admitting: Podiatry

## 2023-09-17 NOTE — Telephone Encounter (Signed)
 Pt called and has hurt her back so she is postponing surgery from 9/9 to 9/30.  Left message for Montie at the surgery center to change surgery date

## 2023-09-24 ENCOUNTER — Encounter: Admitting: Podiatry

## 2023-10-04 ENCOUNTER — Telehealth: Payer: Self-pay | Admitting: Podiatry

## 2023-10-04 NOTE — Telephone Encounter (Signed)
 Pt left message stating she is going to have to cancel surgery again. She is having to have immediate sinus surgery.   I returned call and left message for pt that I need to talk with her directly to cancel surgery.

## 2023-10-05 NOTE — Telephone Encounter (Signed)
 Pt called back and has to have emergent sinus surgery and has rescheduled her foot surgery to 10/21.  I left message for [facility] Montie

## 2023-10-08 ENCOUNTER — Encounter: Admitting: Podiatry

## 2023-10-15 ENCOUNTER — Encounter

## 2023-10-29 ENCOUNTER — Encounter

## 2023-11-05 ENCOUNTER — Encounter

## 2023-11-19 ENCOUNTER — Encounter
